# Patient Record
Sex: Female | Born: 1955 | Race: White | Hispanic: No | Marital: Married | Smoking: Former smoker
Health system: Southern US, Community
[De-identification: ages and names within clinical notes are randomized; demographics above are authoritative.]

## PROBLEM LIST (undated history)

## (undated) DIAGNOSIS — K219 Gastro-esophageal reflux disease without esophagitis: Secondary | ICD-10-CM

## (undated) DIAGNOSIS — F32A Depression, unspecified: Secondary | ICD-10-CM

## (undated) DIAGNOSIS — J449 Chronic obstructive pulmonary disease, unspecified: Secondary | ICD-10-CM

## (undated) DIAGNOSIS — M1990 Unspecified osteoarthritis, unspecified site: Secondary | ICD-10-CM

## (undated) DIAGNOSIS — I10 Essential (primary) hypertension: Secondary | ICD-10-CM

## (undated) DIAGNOSIS — J189 Pneumonia, unspecified organism: Secondary | ICD-10-CM

## (undated) DIAGNOSIS — Z8489 Family history of other specified conditions: Secondary | ICD-10-CM

## (undated) DIAGNOSIS — F1020 Alcohol dependence, uncomplicated: Secondary | ICD-10-CM

## (undated) DIAGNOSIS — M7660 Achilles tendinitis, unspecified leg: Secondary | ICD-10-CM

## (undated) DIAGNOSIS — E785 Hyperlipidemia, unspecified: Secondary | ICD-10-CM

## (undated) DIAGNOSIS — G459 Transient cerebral ischemic attack, unspecified: Secondary | ICD-10-CM

## (undated) DIAGNOSIS — I493 Ventricular premature depolarization: Secondary | ICD-10-CM

## (undated) DIAGNOSIS — F419 Anxiety disorder, unspecified: Secondary | ICD-10-CM

## (undated) DIAGNOSIS — G4733 Obstructive sleep apnea (adult) (pediatric): Secondary | ICD-10-CM

## (undated) DIAGNOSIS — F329 Major depressive disorder, single episode, unspecified: Secondary | ICD-10-CM

## (undated) DIAGNOSIS — Z9889 Other specified postprocedural states: Secondary | ICD-10-CM

## (undated) DIAGNOSIS — R0600 Dyspnea, unspecified: Secondary | ICD-10-CM

## (undated) HISTORY — DX: Achilles tendinitis, unspecified leg: M76.60

## (undated) HISTORY — DX: Chronic obstructive pulmonary disease, unspecified: J44.9

## (undated) HISTORY — DX: Essential (primary) hypertension: I10

## (undated) HISTORY — PX: TONSILLECTOMY: SUR1361

## (undated) HISTORY — DX: Transient cerebral ischemic attack, unspecified: G45.9

## (undated) HISTORY — DX: Obstructive sleep apnea (adult) (pediatric): G47.33

## (undated) HISTORY — DX: Hyperlipidemia, unspecified: E78.5

## (undated) HISTORY — PX: BREAST BIOPSY: SHX20

## (undated) HISTORY — DX: Major depressive disorder, single episode, unspecified: F32.9

## (undated) HISTORY — PX: CERVICAL SPINE SURGERY: SHX589

## (undated) HISTORY — DX: Depression, unspecified: F32.A

## (undated) HISTORY — DX: Ventricular premature depolarization: I49.3

## (undated) HISTORY — DX: Alcohol dependence, uncomplicated: F10.20

---

## 1996-12-13 HISTORY — PX: BREAST EXCISIONAL BIOPSY: SUR124

## 1998-07-05 ENCOUNTER — Inpatient Hospital Stay (HOSPITAL_COMMUNITY): Admission: EM | Admit: 1998-07-05 | Discharge: 1998-07-08 | Payer: Self-pay | Admitting: Emergency Medicine

## 1998-07-13 ENCOUNTER — Encounter (HOSPITAL_COMMUNITY): Admission: RE | Admit: 1998-07-13 | Discharge: 1998-10-11 | Payer: Self-pay | Admitting: Psychiatry

## 2000-01-07 ENCOUNTER — Encounter: Admission: RE | Admit: 2000-01-07 | Discharge: 2000-01-07 | Payer: Self-pay | Admitting: Internal Medicine

## 2000-01-10 ENCOUNTER — Encounter: Payer: Self-pay | Admitting: Internal Medicine

## 2000-03-21 ENCOUNTER — Other Ambulatory Visit: Admission: RE | Admit: 2000-03-21 | Discharge: 2000-03-21 | Payer: Self-pay | Admitting: Obstetrics and Gynecology

## 2000-05-22 ENCOUNTER — Encounter: Admission: RE | Admit: 2000-05-22 | Discharge: 2000-05-22 | Payer: Self-pay | Admitting: Family Medicine

## 2000-05-22 ENCOUNTER — Encounter: Payer: Self-pay | Admitting: Family Medicine

## 2000-05-30 ENCOUNTER — Encounter: Admission: RE | Admit: 2000-05-30 | Discharge: 2000-06-27 | Payer: Self-pay | Admitting: Family Medicine

## 2000-08-28 ENCOUNTER — Encounter: Payer: Self-pay | Admitting: Internal Medicine

## 2000-08-28 ENCOUNTER — Encounter: Admission: RE | Admit: 2000-08-28 | Discharge: 2000-08-28 | Payer: Self-pay | Admitting: Internal Medicine

## 2001-08-28 ENCOUNTER — Encounter: Admission: RE | Admit: 2001-08-28 | Discharge: 2001-08-28 | Payer: Self-pay | Admitting: Internal Medicine

## 2001-08-28 ENCOUNTER — Encounter: Payer: Self-pay | Admitting: Internal Medicine

## 2001-09-24 ENCOUNTER — Other Ambulatory Visit: Admission: RE | Admit: 2001-09-24 | Discharge: 2001-09-24 | Payer: Self-pay | Admitting: Internal Medicine

## 2002-12-02 ENCOUNTER — Encounter: Admission: RE | Admit: 2002-12-02 | Discharge: 2002-12-02 | Payer: Self-pay | Admitting: Internal Medicine

## 2002-12-02 ENCOUNTER — Encounter: Payer: Self-pay | Admitting: Internal Medicine

## 2004-04-27 ENCOUNTER — Encounter: Admission: RE | Admit: 2004-04-27 | Discharge: 2004-04-27 | Payer: Self-pay | Admitting: Internal Medicine

## 2005-06-13 ENCOUNTER — Encounter: Admission: RE | Admit: 2005-06-13 | Discharge: 2005-06-13 | Payer: Self-pay | Admitting: Internal Medicine

## 2005-10-19 ENCOUNTER — Ambulatory Visit: Payer: Self-pay | Admitting: Internal Medicine

## 2005-10-22 ENCOUNTER — Emergency Department: Payer: Self-pay | Admitting: Emergency Medicine

## 2005-12-01 ENCOUNTER — Other Ambulatory Visit: Payer: Self-pay

## 2005-12-01 ENCOUNTER — Inpatient Hospital Stay: Payer: Self-pay | Admitting: Internal Medicine

## 2005-12-09 ENCOUNTER — Ambulatory Visit: Payer: Self-pay | Admitting: Psychiatry

## 2006-08-01 ENCOUNTER — Encounter: Admission: RE | Admit: 2006-08-01 | Discharge: 2006-08-01 | Payer: Self-pay | Admitting: Internal Medicine

## 2006-09-19 ENCOUNTER — Ambulatory Visit (HOSPITAL_COMMUNITY): Admission: RE | Admit: 2006-09-19 | Discharge: 2006-09-19 | Payer: Self-pay | Admitting: Gastroenterology

## 2007-01-01 ENCOUNTER — Emergency Department (HOSPITAL_COMMUNITY): Admission: EM | Admit: 2007-01-01 | Discharge: 2007-01-02 | Payer: Self-pay | Admitting: Emergency Medicine

## 2007-08-03 ENCOUNTER — Other Ambulatory Visit: Admission: RE | Admit: 2007-08-03 | Discharge: 2007-08-03 | Payer: Self-pay | Admitting: Internal Medicine

## 2007-08-03 ENCOUNTER — Encounter: Admission: RE | Admit: 2007-08-03 | Discharge: 2007-08-03 | Payer: Self-pay | Admitting: Internal Medicine

## 2007-10-05 ENCOUNTER — Ambulatory Visit: Payer: Self-pay | Admitting: Internal Medicine

## 2007-10-05 ENCOUNTER — Encounter: Payer: Self-pay | Admitting: Family Medicine

## 2007-10-05 DIAGNOSIS — R079 Chest pain, unspecified: Secondary | ICD-10-CM | POA: Insufficient documentation

## 2007-11-19 ENCOUNTER — Telehealth: Payer: Self-pay | Admitting: Family Medicine

## 2008-05-01 ENCOUNTER — Emergency Department (HOSPITAL_COMMUNITY): Admission: EM | Admit: 2008-05-01 | Discharge: 2008-05-01 | Payer: Self-pay | Admitting: Emergency Medicine

## 2008-07-11 ENCOUNTER — Ambulatory Visit (HOSPITAL_COMMUNITY): Admission: RE | Admit: 2008-07-11 | Discharge: 2008-07-11 | Payer: Self-pay | Admitting: Internal Medicine

## 2008-08-28 ENCOUNTER — Ambulatory Visit (HOSPITAL_COMMUNITY): Admission: RE | Admit: 2008-08-28 | Discharge: 2008-08-28 | Payer: Self-pay | Admitting: Neurosurgery

## 2008-10-09 ENCOUNTER — Inpatient Hospital Stay (HOSPITAL_COMMUNITY): Admission: RE | Admit: 2008-10-09 | Discharge: 2008-10-10 | Payer: Self-pay | Admitting: Neurosurgery

## 2008-12-02 ENCOUNTER — Telehealth: Payer: Self-pay | Admitting: Family Medicine

## 2008-12-08 ENCOUNTER — Telehealth: Payer: Self-pay | Admitting: Family Medicine

## 2009-07-14 ENCOUNTER — Inpatient Hospital Stay (HOSPITAL_COMMUNITY): Admission: EM | Admit: 2009-07-14 | Discharge: 2009-07-15 | Payer: Self-pay | Admitting: Emergency Medicine

## 2010-09-07 ENCOUNTER — Encounter: Admission: RE | Admit: 2010-09-07 | Discharge: 2010-09-07 | Payer: Self-pay | Admitting: Internal Medicine

## 2010-11-14 ENCOUNTER — Encounter: Payer: Self-pay | Admitting: Internal Medicine

## 2010-11-15 ENCOUNTER — Encounter: Payer: Self-pay | Admitting: Internal Medicine

## 2011-01-28 LAB — DIFFERENTIAL
Basophils Absolute: 0.1 10*3/uL (ref 0.0–0.1)
Basophils Relative: 1 % (ref 0–1)
Eosinophils Relative: 2 % (ref 0–5)
Lymphocytes Relative: 9 % — ABNORMAL LOW (ref 12–46)
Lymphs Abs: 1 10*3/uL (ref 0.7–4.0)
Monocytes Absolute: 0.6 10*3/uL (ref 0.1–1.0)
Monocytes Relative: 6 % (ref 3–12)
Neutrophils Relative %: 82 % — ABNORMAL HIGH (ref 43–77)

## 2011-01-28 LAB — CBC
HCT: 40 % (ref 36.0–46.0)
HCT: 43 % (ref 36.0–46.0)
Hemoglobin: 13.8 g/dL (ref 12.0–15.0)
Hemoglobin: 14.6 g/dL (ref 12.0–15.0)
MCHC: 33.9 g/dL (ref 30.0–36.0)
MCV: 90.5 fL (ref 78.0–100.0)
Platelets: 216 10*3/uL (ref 150–400)
RBC: 4.43 MIL/uL (ref 3.87–5.11)
RBC: 4.75 MIL/uL (ref 3.87–5.11)
RDW: 13.5 % (ref 11.5–15.5)

## 2011-01-28 LAB — COMPREHENSIVE METABOLIC PANEL
BUN: 13 mg/dL (ref 6–23)
Calcium: 9.7 mg/dL (ref 8.4–10.5)
GFR calc non Af Amer: >60 mL/min (ref >60)
Sodium: 131 mEq/L — ABNORMAL LOW (ref 135–145)

## 2011-01-28 LAB — BASIC METABOLIC PANEL
BUN: 10 mg/dL (ref 6–23)
CO2: 25 mEq/L (ref 19–32)
Calcium: 8.9 mg/dL (ref 8.4–10.5)
Chloride: 94 mEq/L — ABNORMAL LOW (ref 96–112)
Creatinine, Ser: 0.7 mg/dL (ref 0.4–1.2)
GFR calc non Af Amer: >60 mL/min (ref >60)
Glucose, Bld: 117 mg/dL — ABNORMAL HIGH (ref 70–99)
Potassium: 3.4 mEq/L — ABNORMAL LOW (ref 3.5–5.1)

## 2011-01-28 LAB — PROTIME-INR: Prothrombin Time: 11.2 seconds — ABNORMAL LOW (ref 11.6–15.2)

## 2011-01-28 LAB — APTT: aPTT: 24 seconds (ref 24–37)

## 2011-03-08 NOTE — Op Note (Signed)
NAMEMACKENSEY, BOLTE NO.:  [id number]   MEDICAL RECORD NO.:  [id number]          PATIENT TYPE:  INP   LOCATION:  3028                         FACILITY:  [facility]   PHYSICIAN:  Cristi Loron, M.D.DATE OF BIRTH:  September 29, 1956   DATE OF PROCEDURE:  10/09/2008  DATE OF DISCHARGE:                               OPERATIVE REPORT   BRIEF HISTORY:  The patient is a 55 year old white female who has  suffered from neck and arm pain and a cervical myelopathy.  She failed  medical management worked up with a cervical MRI, which demonstrated  that the patient had spondylolisthesis at C3-C4 with stenosis and  spondylosis at C3-C4, C4-C5, C5-C6.  I discussed the various treatment  option with the patient including surgery.  She has weighed the risks,  benefits, and alternatives of surgery, and decided to proceed with C3-  C4, C4-C5, C5-C6 anterior cervical diskectomy fusion and plating.   PREOPERATIVE DIAGNOSES:  C3-C4, C4-C5, C5-C6 disk degeneration,  spondylosis, stenosis, spondylolisthesis, cervical  myelopathy/radiculopathy, cervicalgia.   POSTOPERATIVE DIAGNOSES:  C3-C4, C4-C5, C5-C6 disk degeneration,  spondylosis, stenosis, spondylolisthesis, cervical  myelopathy/radiculopathy, cervicalgia.   PROCEDURE:  C3-C4, C4-C5, C5-C6 extensive anterior cervical  diskectomy/decompression; C3-C4, C4-C5, C5-C6 anterior interbody  arthrodesis with local morselized autograft bone and Actifuse bone graft  extender; insertion of interbody prosthesis at C3-C4, C4-C5, C5-C6  (Novel PEEK interbody prosthesis); anterior cervical plate at L2-G4 with  Medtronic Atlantis translational plate.   SURGEON:  Cristi Loron, MD   ASSISTANT:  Clydene Fake, MD   ANESTHESIA:  General endotracheal.   ESTIMATED BLOOD LOSS:  150 mL.   SPECIMENS:  None.   DRAINS:  None.   COMPLICATIONS:  None.   DESCRIPTION OF PROCEDURE:  The patient was brought to the operating room  by anesthesia  team.  General endotracheal anesthesia was induced.  The  patient remained in the supine position.  A roll was placed under  shoulders and placed her neck in slight extension.  Her anterior  cervical region was then prepared with Betadine scrub with Betadine  solution.  Sterile drapes were applied.  I then injected the area to be  incised with Marcaine with epinephrine solution.  I used a scalpel to  make a transverse incision in the patient's left anterior neck.  I used  the Metzenbaum scissors to divide the platysma muscle and then to  dissect medial to sternocleidomastoid muscle, jugular vein, and carotid  artery.  I carefully dissected down towards the anterior cervical spine  identifying the esophagus and retracting medially and then used Kitner  swab to clear soft tissue from the anterior cervical spine exposing the  intervertebral disk spaces.  We then inserted a bent spinal needle into  the upper exposed intervertebral disk space.  We obtained intraoperative  radiograph to confirm our location.   We then used electrocautery to detach the medial border of the longus  colli muscle bilaterally from C3-C4, C4-C5, and C5-C6 interspaces.  We  then inserted the Caspar self-retaining retractor underneath the longus  colli muscle bilaterally to provide exposure.  We began  the  decompression at C3-C4.  We incised the C3-C4 intervertebral disk with a  #15-blade scalpel and performed a partial intervertebral diskectomy with  a pituitary forceps and the Carlens curettes.  We then inserted the  distraction screws at C3-C4 and distracted interspace.  We then used a  high-speed drill to decorticate the vertebral endplates at C3-C4, drill  away the remainder of C3-C4 intervertebral disk to drill away some  posterior spondylosis, and then to thin out the posterior longitudinal  ligament.  We then incised the ligament with arachnoid knife and then  removed it with a Kerrison punch undercutting the  vertebral endplates at  C3-C4 decompressing the thecal sac.  We then performed a foraminotomy  about the bilateral C4 nerve root completing the decompression at this  level.   We then repeated this procedure in an analogous fashion at C4-C5, C5-C6  decompressing the C4-C5 and C5-C6 thecal sac and bilateral C5-C6 nerve  roots.  This completed the decompression.   We now turned our attention to the arthrodesis.  We used the trial  spacers and determined to use a 5-mm medium PEEK interbody prosthesis at  each level.  We prefilled this prosthesis with a combination of local  autograft bone.  We obtained the decompression as well as Actifuse bone  graft extender.  We then inserted the prosthesis into the distracted  interspaces at C3-C4, C4-C5, and C5-C6.  We then removed distraction  screws.  There was a good snug fit of the prosthesis at each level.   We now turned our attention to anterior spinal instrumentation.  We used  a high-speed drill to remove some ventral spondylosis from the vertebral  endplates at C3-C4, C4-C5, and C5-C6, so that the plate will lay down  flat.  We selected the appropriate-length Atlantis translational  anterior cervical plate and laid it along the anterior aspect of the  vertebral bodies from C3-C6.  We then drilled two 11-mm holes at C3-C4,  C5-C6 and then secured the plate to their vertebral bodies by placing  two 14-mm screws at C3-C4, C5-C6.  We obtained intraoperative  radiograph, which demonstrated good position of instrumentation with  limited visualization of the lower plate screws because of the patient's  shoulders, but it looked good in vivo.  We, therefore, secured the  screws and plate by locking the cams.  This completed the  instrumentation.   We then obtained hemostasis using bipolar electrocautery.  We irrigated  the wound out with bacitracin solution.  We then removed the retractor.  We then inspected the esophagus for any damages, none  apparent.  We then  reapproximated the patient's platysma muscle with interrupted 3-0 Vicryl  suture, subcutaneous tissue with interrupted 3-0 Vicryl suture, and the  skin with Steri-Strips and benzoin.  The wound was then coated with  bacitracin ointment.  A sterile dressing was applied.  The drapes were  removed.  The patient was subsequently extubated by the anesthesia team  and transported to postanesthesia care unit in stable condition.  All  sponge, instrument, and needle counts were correct at the end of this  case.      Cristi Loron, M.D.  Electronically Signed     JDJ/MEDQ  D:  10/09/2008  T:  10/10/2008  Job:  [id number]

## 2011-07-26 LAB — BASIC METABOLIC PANEL
BUN: 6
CO2: 28
GFR calc non Af Amer: >60
Glucose, Bld: 88
Potassium: 4.5

## 2011-07-26 LAB — CBC
HCT: 46.6 — ABNORMAL HIGH
MCHC: 34.2
MCV: 93.9
Platelets: 281
RDW: 13.2

## 2011-07-29 LAB — BASIC METABOLIC PANEL
BUN: 9 mg/dL (ref 6–23)
Calcium: 9.5 mg/dL (ref 8.4–10.5)
Creatinine, Ser: 0.61 mg/dL (ref 0.4–1.2)
GFR calc non Af Amer: >60 mL/min (ref >60)
Glucose, Bld: 100 mg/dL — ABNORMAL HIGH (ref 70–99)

## 2011-07-29 LAB — CBC
Platelets: 312 10*3/uL (ref 150–400)
RDW: 13 % (ref 11.5–15.5)
WBC: 6.1 10*3/uL (ref 4.0–10.5)

## 2012-12-18 ENCOUNTER — Other Ambulatory Visit: Payer: Self-pay | Admitting: Internal Medicine

## 2012-12-24 ENCOUNTER — Ambulatory Visit
Admission: RE | Admit: 2012-12-24 | Discharge: 2012-12-24 | Disposition: A | Discharge status: Home / Self Care | Payer: Self-pay | Source: Ambulatory Visit | Attending: Internal Medicine | Admitting: Internal Medicine

## 2012-12-24 DIAGNOSIS — R29898 Other symptoms and signs involving the musculoskeletal system: Secondary | ICD-10-CM

## 2012-12-24 MED ORDER — GADOBENATE DIMEGLUMINE 529 MG/ML IV SOLN: 17.0000 mL | Freq: Once | INTRAVENOUS | Status: AC | PRN

## 2013-04-19 DIAGNOSIS — R1032 Left lower quadrant pain: Secondary | ICD-10-CM | POA: Diagnosis not present

## 2013-04-22 DIAGNOSIS — R21 Rash and other nonspecific skin eruption: Secondary | ICD-10-CM | POA: Diagnosis not present

## 2013-07-11 DIAGNOSIS — G4733 Obstructive sleep apnea (adult) (pediatric): Secondary | ICD-10-CM | POA: Diagnosis not present

## 2013-07-11 DIAGNOSIS — F17200 Nicotine dependence, unspecified, uncomplicated: Secondary | ICD-10-CM | POA: Diagnosis not present

## 2013-07-11 DIAGNOSIS — I10 Essential (primary) hypertension: Secondary | ICD-10-CM | POA: Diagnosis not present

## 2013-07-11 DIAGNOSIS — F325 Major depressive disorder, single episode, in full remission: Secondary | ICD-10-CM | POA: Diagnosis not present

## 2013-07-11 DIAGNOSIS — J449 Chronic obstructive pulmonary disease, unspecified: Secondary | ICD-10-CM | POA: Diagnosis not present

## 2013-09-27 DIAGNOSIS — K5900 Constipation, unspecified: Secondary | ICD-10-CM | POA: Diagnosis not present

## 2013-09-27 DIAGNOSIS — R109 Unspecified abdominal pain: Secondary | ICD-10-CM | POA: Diagnosis not present

## 2013-09-27 DIAGNOSIS — R5381 Other malaise: Secondary | ICD-10-CM | POA: Diagnosis not present

## 2013-10-03 DIAGNOSIS — R1012 Left upper quadrant pain: Secondary | ICD-10-CM | POA: Diagnosis not present

## 2013-10-03 DIAGNOSIS — F17200 Nicotine dependence, unspecified, uncomplicated: Secondary | ICD-10-CM | POA: Diagnosis not present

## 2014-01-08 ENCOUNTER — Other Ambulatory Visit: Payer: Self-pay

## 2014-01-08 ENCOUNTER — Other Ambulatory Visit: Payer: Self-pay | Admitting: Internal Medicine

## 2014-01-08 ENCOUNTER — Other Ambulatory Visit (HOSPITAL_COMMUNITY)
Admission: RE | Admit: 2014-01-08 | Discharge: 2014-01-08 | Disposition: A | Discharge status: Home / Self Care | Payer: Medicare Other | Source: Ambulatory Visit | Attending: Internal Medicine | Admitting: Internal Medicine

## 2014-01-08 DIAGNOSIS — Z124 Encounter for screening for malignant neoplasm of cervix: Secondary | ICD-10-CM | POA: Diagnosis not present

## 2014-01-08 DIAGNOSIS — Z1331 Encounter for screening for depression: Secondary | ICD-10-CM | POA: Diagnosis not present

## 2014-01-08 DIAGNOSIS — Z1231 Encounter for screening mammogram for malignant neoplasm of breast: Secondary | ICD-10-CM

## 2014-01-08 DIAGNOSIS — Z0000 Encounter for general adult medical examination without abnormal findings: Secondary | ICD-10-CM | POA: Diagnosis not present

## 2014-01-08 DIAGNOSIS — J449 Chronic obstructive pulmonary disease, unspecified: Secondary | ICD-10-CM | POA: Diagnosis not present

## 2014-01-08 DIAGNOSIS — Z1151 Encounter for screening for human papillomavirus (HPV): Secondary | ICD-10-CM | POA: Diagnosis not present

## 2014-01-08 DIAGNOSIS — I10 Essential (primary) hypertension: Secondary | ICD-10-CM | POA: Diagnosis not present

## 2014-01-23 ENCOUNTER — Ambulatory Visit
Admission: RE | Admit: 2014-01-23 | Discharge: 2014-01-23 | Disposition: A | Discharge status: Home / Self Care | Payer: Medicare Other | Source: Ambulatory Visit

## 2014-01-23 DIAGNOSIS — Z1231 Encounter for screening mammogram for malignant neoplasm of breast: Secondary | ICD-10-CM | POA: Diagnosis not present

## 2014-03-20 DIAGNOSIS — H02839 Dermatochalasis of unspecified eye, unspecified eyelid: Secondary | ICD-10-CM | POA: Diagnosis not present

## 2014-03-20 DIAGNOSIS — H2510 Age-related nuclear cataract, unspecified eye: Secondary | ICD-10-CM | POA: Diagnosis not present

## 2014-05-12 DIAGNOSIS — H02839 Dermatochalasis of unspecified eye, unspecified eyelid: Secondary | ICD-10-CM | POA: Diagnosis not present

## 2014-05-12 DIAGNOSIS — H02419 Mechanical ptosis of unspecified eyelid: Secondary | ICD-10-CM | POA: Diagnosis not present

## 2014-05-12 DIAGNOSIS — H01029 Squamous blepharitis unspecified eye, unspecified eyelid: Secondary | ICD-10-CM | POA: Diagnosis not present

## 2014-05-12 DIAGNOSIS — H11439 Conjunctival hyperemia, unspecified eye: Secondary | ICD-10-CM | POA: Diagnosis not present

## 2014-05-16 DIAGNOSIS — H5340 Unspecified visual field defects: Secondary | ICD-10-CM | POA: Diagnosis not present

## 2014-05-16 DIAGNOSIS — H02839 Dermatochalasis of unspecified eye, unspecified eyelid: Secondary | ICD-10-CM | POA: Diagnosis not present

## 2014-05-29 DIAGNOSIS — F17200 Nicotine dependence, unspecified, uncomplicated: Secondary | ICD-10-CM | POA: Diagnosis not present

## 2014-05-29 DIAGNOSIS — I10 Essential (primary) hypertension: Secondary | ICD-10-CM | POA: Diagnosis not present

## 2014-07-10 DIAGNOSIS — F325 Major depressive disorder, single episode, in full remission: Secondary | ICD-10-CM | POA: Diagnosis not present

## 2014-07-10 DIAGNOSIS — J449 Chronic obstructive pulmonary disease, unspecified: Secondary | ICD-10-CM | POA: Diagnosis not present

## 2014-07-10 DIAGNOSIS — I10 Essential (primary) hypertension: Secondary | ICD-10-CM | POA: Diagnosis not present

## 2014-07-10 DIAGNOSIS — E785 Hyperlipidemia, unspecified: Secondary | ICD-10-CM | POA: Diagnosis not present

## 2014-07-10 DIAGNOSIS — F17200 Nicotine dependence, unspecified, uncomplicated: Secondary | ICD-10-CM | POA: Diagnosis not present

## 2014-07-10 DIAGNOSIS — Z23 Encounter for immunization: Secondary | ICD-10-CM | POA: Diagnosis not present

## 2014-07-10 DIAGNOSIS — M549 Dorsalgia, unspecified: Secondary | ICD-10-CM | POA: Diagnosis not present

## 2015-01-08 DIAGNOSIS — J449 Chronic obstructive pulmonary disease, unspecified: Secondary | ICD-10-CM | POA: Diagnosis not present

## 2015-01-08 DIAGNOSIS — I10 Essential (primary) hypertension: Secondary | ICD-10-CM | POA: Diagnosis not present

## 2015-01-08 DIAGNOSIS — F325 Major depressive disorder, single episode, in full remission: Secondary | ICD-10-CM | POA: Diagnosis not present

## 2015-01-08 DIAGNOSIS — Z1389 Encounter for screening for other disorder: Secondary | ICD-10-CM | POA: Diagnosis not present

## 2015-01-08 DIAGNOSIS — F17210 Nicotine dependence, cigarettes, uncomplicated: Secondary | ICD-10-CM | POA: Diagnosis not present

## 2015-02-02 ENCOUNTER — Other Ambulatory Visit: Payer: Self-pay

## 2015-02-02 DIAGNOSIS — Z1231 Encounter for screening mammogram for malignant neoplasm of breast: Secondary | ICD-10-CM

## 2015-02-04 DIAGNOSIS — R590 Localized enlarged lymph nodes: Secondary | ICD-10-CM | POA: Diagnosis not present

## 2015-03-11 ENCOUNTER — Ambulatory Visit
Admission: RE | Admit: 2015-03-11 | Discharge: 2015-03-11 | Disposition: A | Discharge status: Home / Self Care | Payer: Medicare Other | Source: Ambulatory Visit

## 2015-03-11 DIAGNOSIS — Z1231 Encounter for screening mammogram for malignant neoplasm of breast: Secondary | ICD-10-CM

## 2015-06-25 DIAGNOSIS — R21 Rash and other nonspecific skin eruption: Secondary | ICD-10-CM | POA: Diagnosis not present

## 2015-07-17 DIAGNOSIS — L821 Other seborrheic keratosis: Secondary | ICD-10-CM | POA: Diagnosis not present

## 2015-07-17 DIAGNOSIS — L309 Dermatitis, unspecified: Secondary | ICD-10-CM | POA: Diagnosis not present

## 2015-07-17 DIAGNOSIS — D2371 Other benign neoplasm of skin of right lower limb, including hip: Secondary | ICD-10-CM | POA: Diagnosis not present

## 2015-07-20 DIAGNOSIS — J449 Chronic obstructive pulmonary disease, unspecified: Secondary | ICD-10-CM | POA: Diagnosis not present

## 2015-07-20 DIAGNOSIS — F1020 Alcohol dependence, uncomplicated: Secondary | ICD-10-CM | POA: Diagnosis not present

## 2015-07-20 DIAGNOSIS — E669 Obesity, unspecified: Secondary | ICD-10-CM | POA: Diagnosis not present

## 2015-07-20 DIAGNOSIS — F329 Major depressive disorder, single episode, unspecified: Secondary | ICD-10-CM | POA: Diagnosis not present

## 2015-07-20 DIAGNOSIS — I10 Essential (primary) hypertension: Secondary | ICD-10-CM | POA: Diagnosis not present

## 2015-07-20 DIAGNOSIS — Z720 Tobacco use: Secondary | ICD-10-CM | POA: Diagnosis not present

## 2015-07-20 DIAGNOSIS — M25552 Pain in left hip: Secondary | ICD-10-CM | POA: Diagnosis not present

## 2015-07-20 DIAGNOSIS — Z6838 Body mass index (BMI) 38.0-38.9, adult: Secondary | ICD-10-CM | POA: Diagnosis not present

## 2015-07-28 DIAGNOSIS — G4733 Obstructive sleep apnea (adult) (pediatric): Secondary | ICD-10-CM | POA: Diagnosis not present

## 2015-07-28 DIAGNOSIS — J449 Chronic obstructive pulmonary disease, unspecified: Secondary | ICD-10-CM | POA: Diagnosis not present

## 2015-08-28 DIAGNOSIS — J449 Chronic obstructive pulmonary disease, unspecified: Secondary | ICD-10-CM | POA: Diagnosis not present

## 2015-08-28 DIAGNOSIS — G4733 Obstructive sleep apnea (adult) (pediatric): Secondary | ICD-10-CM | POA: Diagnosis not present

## 2015-09-27 DIAGNOSIS — J449 Chronic obstructive pulmonary disease, unspecified: Secondary | ICD-10-CM | POA: Diagnosis not present

## 2015-09-27 DIAGNOSIS — G4733 Obstructive sleep apnea (adult) (pediatric): Secondary | ICD-10-CM | POA: Diagnosis not present

## 2015-10-06 DIAGNOSIS — J069 Acute upper respiratory infection, unspecified: Secondary | ICD-10-CM | POA: Diagnosis not present

## 2015-10-28 DIAGNOSIS — G4733 Obstructive sleep apnea (adult) (pediatric): Secondary | ICD-10-CM | POA: Diagnosis not present

## 2015-10-28 DIAGNOSIS — J449 Chronic obstructive pulmonary disease, unspecified: Secondary | ICD-10-CM | POA: Diagnosis not present

## 2015-11-18 ENCOUNTER — Other Ambulatory Visit: Payer: Self-pay | Admitting: Internal Medicine

## 2015-11-18 DIAGNOSIS — R1011 Right upper quadrant pain: Secondary | ICD-10-CM

## 2015-11-18 DIAGNOSIS — R112 Nausea with vomiting, unspecified: Secondary | ICD-10-CM | POA: Diagnosis not present

## 2015-11-18 DIAGNOSIS — R109 Unspecified abdominal pain: Secondary | ICD-10-CM | POA: Diagnosis not present

## 2015-11-25 ENCOUNTER — Ambulatory Visit
Admission: RE | Admit: 2015-11-25 | Discharge: 2015-11-25 | Disposition: A | Discharge status: Home / Self Care | Payer: Commercial Managed Care - HMO | Source: Ambulatory Visit | Attending: Internal Medicine | Admitting: Internal Medicine

## 2015-11-25 DIAGNOSIS — R112 Nausea with vomiting, unspecified: Secondary | ICD-10-CM

## 2015-11-25 DIAGNOSIS — R1011 Right upper quadrant pain: Secondary | ICD-10-CM

## 2015-11-25 DIAGNOSIS — R109 Unspecified abdominal pain: Secondary | ICD-10-CM

## 2015-11-25 DIAGNOSIS — K8020 Calculus of gallbladder without cholecystitis without obstruction: Secondary | ICD-10-CM | POA: Diagnosis not present

## 2015-11-27 DIAGNOSIS — K8020 Calculus of gallbladder without cholecystitis without obstruction: Secondary | ICD-10-CM | POA: Diagnosis not present

## 2015-11-27 DIAGNOSIS — M546 Pain in thoracic spine: Secondary | ICD-10-CM | POA: Diagnosis not present

## 2015-11-28 DIAGNOSIS — G4733 Obstructive sleep apnea (adult) (pediatric): Secondary | ICD-10-CM | POA: Diagnosis not present

## 2015-11-28 DIAGNOSIS — J449 Chronic obstructive pulmonary disease, unspecified: Secondary | ICD-10-CM | POA: Diagnosis not present

## 2015-12-26 DIAGNOSIS — G4733 Obstructive sleep apnea (adult) (pediatric): Secondary | ICD-10-CM | POA: Diagnosis not present

## 2015-12-26 DIAGNOSIS — J449 Chronic obstructive pulmonary disease, unspecified: Secondary | ICD-10-CM | POA: Diagnosis not present

## 2016-01-15 DIAGNOSIS — G4733 Obstructive sleep apnea (adult) (pediatric): Secondary | ICD-10-CM | POA: Diagnosis not present

## 2016-01-15 DIAGNOSIS — Z0000 Encounter for general adult medical examination without abnormal findings: Secondary | ICD-10-CM | POA: Diagnosis not present

## 2016-01-15 DIAGNOSIS — E669 Obesity, unspecified: Secondary | ICD-10-CM | POA: Diagnosis not present

## 2016-01-15 DIAGNOSIS — E7800 Pure hypercholesterolemia, unspecified: Secondary | ICD-10-CM | POA: Diagnosis not present

## 2016-01-15 DIAGNOSIS — F325 Major depressive disorder, single episode, in full remission: Secondary | ICD-10-CM | POA: Diagnosis not present

## 2016-01-15 DIAGNOSIS — J449 Chronic obstructive pulmonary disease, unspecified: Secondary | ICD-10-CM | POA: Diagnosis not present

## 2016-01-15 DIAGNOSIS — Z1389 Encounter for screening for other disorder: Secondary | ICD-10-CM | POA: Diagnosis not present

## 2016-01-15 DIAGNOSIS — I10 Essential (primary) hypertension: Secondary | ICD-10-CM | POA: Diagnosis not present

## 2016-01-15 DIAGNOSIS — F17200 Nicotine dependence, unspecified, uncomplicated: Secondary | ICD-10-CM | POA: Diagnosis not present

## 2016-01-26 DIAGNOSIS — G4733 Obstructive sleep apnea (adult) (pediatric): Secondary | ICD-10-CM | POA: Diagnosis not present

## 2016-01-26 DIAGNOSIS — J449 Chronic obstructive pulmonary disease, unspecified: Secondary | ICD-10-CM | POA: Diagnosis not present

## 2016-02-08 DIAGNOSIS — R509 Fever, unspecified: Secondary | ICD-10-CM | POA: Diagnosis not present

## 2016-02-08 DIAGNOSIS — J069 Acute upper respiratory infection, unspecified: Secondary | ICD-10-CM | POA: Diagnosis not present

## 2016-02-08 DIAGNOSIS — R05 Cough: Secondary | ICD-10-CM | POA: Diagnosis not present

## 2016-02-25 DIAGNOSIS — J449 Chronic obstructive pulmonary disease, unspecified: Secondary | ICD-10-CM | POA: Diagnosis not present

## 2016-02-25 DIAGNOSIS — G4733 Obstructive sleep apnea (adult) (pediatric): Secondary | ICD-10-CM | POA: Diagnosis not present

## 2016-03-10 DIAGNOSIS — J209 Acute bronchitis, unspecified: Secondary | ICD-10-CM | POA: Diagnosis not present

## 2016-03-27 DIAGNOSIS — G4733 Obstructive sleep apnea (adult) (pediatric): Secondary | ICD-10-CM | POA: Diagnosis not present

## 2016-03-27 DIAGNOSIS — J449 Chronic obstructive pulmonary disease, unspecified: Secondary | ICD-10-CM | POA: Diagnosis not present

## 2016-04-14 ENCOUNTER — Other Ambulatory Visit: Payer: Self-pay | Admitting: Gastroenterology

## 2016-04-14 DIAGNOSIS — K621 Rectal polyp: Secondary | ICD-10-CM | POA: Diagnosis not present

## 2016-04-14 DIAGNOSIS — Z1211 Encounter for screening for malignant neoplasm of colon: Secondary | ICD-10-CM | POA: Diagnosis not present

## 2016-04-26 DIAGNOSIS — G4733 Obstructive sleep apnea (adult) (pediatric): Secondary | ICD-10-CM | POA: Diagnosis not present

## 2016-04-26 DIAGNOSIS — J449 Chronic obstructive pulmonary disease, unspecified: Secondary | ICD-10-CM | POA: Diagnosis not present

## 2016-05-27 DIAGNOSIS — G4733 Obstructive sleep apnea (adult) (pediatric): Secondary | ICD-10-CM | POA: Diagnosis not present

## 2016-05-27 DIAGNOSIS — J449 Chronic obstructive pulmonary disease, unspecified: Secondary | ICD-10-CM | POA: Diagnosis not present

## 2016-06-27 DIAGNOSIS — J449 Chronic obstructive pulmonary disease, unspecified: Secondary | ICD-10-CM | POA: Diagnosis not present

## 2016-06-27 DIAGNOSIS — G4733 Obstructive sleep apnea (adult) (pediatric): Secondary | ICD-10-CM | POA: Diagnosis not present

## 2016-07-18 DIAGNOSIS — Z23 Encounter for immunization: Secondary | ICD-10-CM | POA: Diagnosis not present

## 2016-07-18 DIAGNOSIS — I10 Essential (primary) hypertension: Secondary | ICD-10-CM | POA: Diagnosis not present

## 2016-07-18 DIAGNOSIS — R200 Anesthesia of skin: Secondary | ICD-10-CM | POA: Diagnosis not present

## 2016-07-18 DIAGNOSIS — J449 Chronic obstructive pulmonary disease, unspecified: Secondary | ICD-10-CM | POA: Diagnosis not present

## 2016-09-28 ENCOUNTER — Encounter: Payer: Self-pay | Admitting: Neurology

## 2016-09-28 ENCOUNTER — Ambulatory Visit (INDEPENDENT_AMBULATORY_CARE_PROVIDER_SITE_OTHER): Payer: Commercial Managed Care - HMO | Admitting: Neurology

## 2016-09-28 VITALS — BP 130/88 | HR 74 | Ht 60.0 in | Wt 191.5 lb

## 2016-09-28 DIAGNOSIS — M4802 Spinal stenosis, cervical region: Secondary | ICD-10-CM | POA: Insufficient documentation

## 2016-09-28 DIAGNOSIS — G5603 Carpal tunnel syndrome, bilateral upper limbs: Secondary | ICD-10-CM | POA: Diagnosis not present

## 2016-09-28 DIAGNOSIS — F17200 Nicotine dependence, unspecified, uncomplicated: Secondary | ICD-10-CM

## 2016-09-28 NOTE — Progress Notes (Signed)
[facility] Clinic Note - Initial Visit   Date: 09/28/16  LILLER ASFOUR MRN: [id number] DOB: [birth year]   Dear Dr. Laurann Montana:  Thank you for your kind referral of Christina Walsh [facility] for consultation of bilateral hand paresthesias. Although her history is well known to you, please allow [name] to reiterate it for the purpose of our medical record. The patient was accompanied to the clinic by self.    History of Present Illness: Christina Walsh is a 60 y.o. left-handed Caucasian female with hypertension, COPD, tobacco use, depression, hyperlipidemia, multilevel compressive cervical myelopathy with cord compression at C3-4 and stenosis at C3-4, C4-5, and C5-6 (2009) s/p ACDF presenting for evaluation of bilateral hand paresthesias.    Starting in 2009, she starting have numbness/tingling over the left forearm and hand and thought she was having TIAs.  Eventually symptoms started involving her right hand.  MRI cervical spine showed severe multilevel degenerative changes with cord compression and edema at C3-4, she also had central stenosis at C4-5 and C5-6.  She had ACDF by Dr. Arnoldo Morale which alleviated her neck pain, but did not resolve her hand paresthesias. Her forearm paresthesias have remained constant and unchanged since her surgery.  However, in September 2017, she began noticing worsening right hand tingling and numbness, especially over the first three fingers which is new.  She is dropping objects. She enjoys sewing and often cannot feel the fabric in her fingers, so sometimes will have to detect sensation by applying the material against her face to determine the texture.  She also reports playing games online for about 4 hours nightly and uses the mouse with her right hand repetitively.     Out-side paper records, electronic medical record, and images have been reviewed where available and summarized as:  MRI cervical spine wo contrast 07/12/2008: 1.  Severe  multilevel mid and upper cervical degenerative disease, with cord compression and edema at C3-C4.  Because of the cord edema, Dr. Gerilyn Pilgrim telephoned the on-call physician for Dr. Laurann Montana, Dr. Maxwell Caul, at [phone] hours on date of interpretation, 07/12/2008. 2.  Severe multilevel facet arthrosis, age advanced.  Favor inflammatory arthritis such as pseudogout/CPPD. 3.  With associated subchondral inflammatory changes. 4.  Severe multifactorial central stenosis C3-C4 and C4-C5, with moderate C5-C6 central stenosis. 5.  Multilevel bilateral foraminal narrowing due to uncovertebral spurring and facet spurring.  MRI brain wwo contrast 12/24/2012: No acute intracranial abnormality.  No evidence of acute or chronic ischemia. Spinal stenosis at the C2 level in the cervical spine.   Past Medical History:  Diagnosis Date  . Achilles tendinitis   . Alcoholism ([place])   . COPD (chronic obstructive pulmonary disease) ([place])   . Depression   . Hyperlipidemia   . Hypertension   . OSA (obstructive sleep apnea)   . PVC (premature ventricular contraction)   . TIA (transient ischemic attack)     Past Surgical History:  Procedure Laterality Date  . BREAST BIOPSY    . CERVICAL SPINE SURGERY    . CESAREAN SECTION    . TONSILLECTOMY       Medications:  Outpatient Encounter Prescriptions as of 09/28/2016  Medication Sig Note  . aspirin EC 81 MG tablet Take 81 mg by mouth daily.   Marland Kitchen buPROPion (WELLBUTRIN XL) 150 MG 24 hr tablet  09/28/2016: Received from: External Pharmacy  . CARTIA XT 240 MG 24 hr capsule  09/28/2016: Received from: External Pharmacy  . cloNIDine (CATAPRES) 0.1 MG tablet  09/28/2016: Received from:  External Pharmacy  . diazepam (VALIUM) 5 MG tablet  09/28/2016: Received from: External Pharmacy  . diltiazem (DILACOR XR) 240 MG 24 hr capsule Take 240 mg by mouth daily.   . fluocinonide ointment (LIDEX) 0.05 %  09/28/2016: Received from: External Pharmacy  . HYDROcodone-acetaminophen (NORCO) 10-325  MG tablet  09/28/2016: Received from: External Pharmacy  . losartan-hydrochlorothiazide (HYZAAR) 100-12.5 MG tablet  09/28/2016: Received from: External Pharmacy  . PARoxetine (PAXIL) 40 MG tablet  09/28/2016: Received from: External Pharmacy   No facility-administered encounter medications on file as of 09/28/2016.      Allergies:  Allergies  Allergen Reactions  . Augmentin [Amoxicillin-Pot Clavulanate]   . Celexa [Citalopram Hydrobromide]   . Ciprocin-Fluocin-Procin [Fluocinolone]   . Codeine   . Coppertone Sport Spray Spf30 [Sunscreens]   . Cozaar [Losartan Potassium]   . Felodipine   . Norvasc [Amlodipine Besylate]   . Zoloft [Sertraline Hcl]     Family History: Family History  Problem Relation Age of Onset  . Alcohol abuse Mother   . Hypertension Father   . Heart attack Father   . Heart disease Father     Social History: Social History  Substance Use Topics  . Smoking status: Current Every Day Smoker  . Smokeless tobacco: Never Used  . Alcohol use No   Social History   Social History Narrative   Lives with husband in a one story home.  Has 2 grown children.  On disability.  Education: some college.     Review of Systems:  CONSTITUTIONAL: No fevers, chills, night sweats, or weight loss.   EYES: No visual changes or eye pain ENT: No hearing changes.  No history of nose bleeds.   RESPIRATORY: No cough, wheezing and shortness of breath.   CARDIOVASCULAR: Negative for chest pain, and palpitations.   GI: Negative for abdominal discomfort, blood in stools or black stools.  No recent change in bowel habits.   GU:  No history of incontinence.   MUSCLOSKELETAL: +history of joint pain or swelling.  No myalgias.   SKIN: Negative for lesions, rash, and itching.   HEMATOLOGY/ONCOLOGY: Negative for prolonged bleeding, bruising easily, and swollen nodes.  No history of cancer.   ENDOCRINE: Negative for cold or heat intolerance, polydipsia or goiter.   PSYCH:  +depression or  anxiety symptoms.   NEURO: As Above.   Vital Signs:  BP 130/88   Pulse 74   Ht 5' (1.524 m)   Wt 191 lb 8 oz (86.9 kg)   SpO2 96%   BMI 37.40 kg/m    General Medical Exam:   General:  Well appearing, comfortable.   Eyes/ENT: see cranial nerve examination.   Neck: No masses appreciated.  Full range of motion without tenderness.  No carotid bruits. Respiratory:  Clear to auscultation, good air entry bilaterally.   Cardiac:  Regular rate and rhythm, no murmur.   Extremities:  No deformities, edema, or skin discoloration.  Skin:  No rashes or lesions.  Neurological Exam: MENTAL STATUS including orientation to time, place, person, recent and remote memory, attention span and concentration, language, and fund of knowledge is normal.  Speech is not dysarthric.  CRANIAL NERVES: II:  No visual field defects.  Unremarkable fundi.   III-IV-VI: Pupils equal round and reactive to light.  Normal conjugate, extra-ocular eye movements in all directions of gaze.  No nystagmus.  No ptosis.   V:  Normal facial sensation.     VII:  Normal facial symmetry and movements.  VIII:  Normal hearing and vestibular function.   IX-X:  Normal palatal movement.   XI:  Normal shoulder shrug and head rotation.   XII:  Normal tongue strength and range of motion, no deviation or fasciculation.  MOTOR:  No atrophy, fasciculations or abnormal movements.  No pronator drift.  Tone is normal.    Right Upper Extremity:    Left Upper Extremity:    Deltoid  5/5   Deltoid  5/5   Biceps  5/5   Biceps  5/5   Triceps  5/5   Triceps  5/5   Wrist extensors  5/5   Wrist extensors  5/5   Wrist flexors  5/5   Wrist flexors  5/5   Finger extensors  5/5   Finger extensors  5/5   Finger flexors  5/5   Finger flexors  5/5   Dorsal interossei  5/5   Dorsal interossei  5/5   Abductor pollicis  4+/5   Abductor pollicis  5/5   Tone ([place] scale)  0  Tone ([place] scale)  0   Right Lower Extremity:    Left Lower Extremity:     Hip flexors  5/5   Hip flexors  5/5   Hip extensors  5/5   Hip extensors  5/5   Knee flexors  5/5   Knee flexors  5/5   Knee extensors  5/5   Knee extensors  5/5   Dorsiflexors  5/5   Dorsiflexors  5/5   Plantarflexors  5/5   Plantarflexors  5/5   Toe extensors  5/5   Toe extensors  5/5   Toe flexors  5/5   Toe flexors  5/5   Tone ([place] scale)  0  Tone ([place] scale)  0   MSRs:  Right                                                                 Left brachioradialis 2+  brachioradialis 3+  biceps 2+  biceps 3+  triceps 2+  triceps 2+  patellar 2+  patellar 2+  ankle jerk 2+  ankle jerk 2+  Hoffman no  Hoffman no  plantar response down  plantar response down   SENSORY:  Normal and symmetric perception of light touch, pinprick, vibration, and proprioception.  Tinel's sign is positive at the right wrist.  COORDINATION/GAIT: Normal finger-to- nose-finger.  Intact rapid alternating movements bilaterally.   Gait narrow based and stable.    IMPRESSION: 1.  Right carpal tunnel syndrome causing new right hand paresthesias.  With her history of repetitive activity of the hands, I encouraged her to start using a wrist splint and avoid hyperflexion of the wrist.  NCS/EMG of both arms will be performed to assess the severity of symptoms and evaluate whether there is a superimposed cervical radiculopathy.  2.  History of multilevel compressive cervical myelopathy with cord compression at C3-4 and stenosis at C3-4, C4-5, and C5-6 (2009) s/p ACDF with residual forearm paresthesias.  Explained that with the severity of her cord compression, she may not have complete neurological recovery and her paresthesias may be lasting deficits. 3.  Tobacco use disorder.   She is trying to quit and using gums as well as cutting back. She has been smoking since the  age of 52 and recently but back to 1ppd.  Smoking cessation instruction/counseling given:  counseled patient on the dangers of tobacco use,  advised patient to stop smoking, and reviewed strategies to maximize success  Further recommendations will be based on the results of her EMG  The duration of this appointment visit was 45 minutes of face-to-face time with the patient.  Greater than 50% of this time was spent in counseling, explanation of diagnosis, planning of further management, and coordination of care.   Thank you for allowing me to participate in patient's care.  If I can answer any additional questions, I would be pleased to do so.    Sincerely,    Donika K. Posey Pronto, DO

## 2016-09-28 NOTE — Patient Instructions (Addendum)
1.  NCS/EMG of both arms on December 7th at 10:15am.  Please arrive 15 minutes prior to appointment.   2.  Recommend you start using a wrist splint on the right hand as I suspect that your tingling is due to carpal tunnel syndrome.  3.  Avoid over flexion at the wrist  4.  Stop smoking

## 2016-09-29 ENCOUNTER — Ambulatory Visit (INDEPENDENT_AMBULATORY_CARE_PROVIDER_SITE_OTHER): Payer: Commercial Managed Care - HMO | Admitting: Neurology

## 2016-09-29 DIAGNOSIS — G5603 Carpal tunnel syndrome, bilateral upper limbs: Secondary | ICD-10-CM | POA: Diagnosis not present

## 2016-09-29 DIAGNOSIS — G5622 Lesion of ulnar nerve, left upper limb: Secondary | ICD-10-CM

## 2016-09-29 DIAGNOSIS — M5412 Radiculopathy, cervical region: Secondary | ICD-10-CM

## 2016-09-29 NOTE — Procedures (Signed)
[facility]  [place], [address]  [place], [place] [address] Tel: [phone] Fax:  [phone] Test Date:  09/29/2016  Patient: Christina Walsh DOB: 1955/11/13 Physician: Narda Amber, DO  Sex: Female Height: 5' " Ref Phys: Narda Amber, DO  ID#: [id number] Temp: 33.3C Technician: Jerilynn Mages. Dean   Patient Complaints: This is a 60 year old female with history of cervical spondylosis at C3-4, C4-5, and C5-6 s/p ACDF (2009) referred for evaluation of bilateral hand pain, numbness, weakness and tingling, worse on the left.  NCV & EMG Findings: Extensive electrodiagnostic testing of the left upper extremity and additional studies of the right shows:  1. Bilateral median and left ulnar sensory response is absent. Right ulnar sensory response is within normal limits. 2. Bilateral median motor responses show prolonged distal onset latency (L5.3, R4.8 ms) and reduced amplitude (L2.8, R1.4 mV).  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  Left ulnar motor shows decreased conduction velocity (A Elbow-B Elbow, L45, L43 m/s).  Right ulnar motor responses within normal limits. 3. Chronic motor axon loss changes are seen affecting bilateral abductor pollicis brevis muscles and C5-C6 myotomes.  Additionally on the left side. There are chronic motor axon loss changes affecting the ulnar innervated muscles. There is no evidence of accompanied active denervation.  Impression: 1. Bilateral median neuropathy at or distal to the wrist, consistent with the clinical diagnosis of carpal tunnel syndrome. Overall, these findings are severe in degree electrically. 2. Left ulnar neuropathy with slowing across the elbow, demyelinating and axon loss in type. 3. There are residual findings of an old C5-C6 radiculopathy affecting  bilateral upper extremities; moderate in degree electrically. 4. Incidentally, there is a Martin-Gruber anastomosis bilaterally, a normal variant.   ___________________________ Narda Amber, DO    Nerve Conduction Studies Anti Sensory Summary Table   Site NR Peak (ms) Norm Peak (ms) P-T Amp (V) Norm P-T Amp  Left Median Anti Sensory (2nd Digit)  33.3C  Wrist NR  <3.8  >10  Right Median Anti Sensory (2nd Digit)  33.3C  Wrist NR  <3.8  >10  Left Ulnar Anti Sensory (5th Digit)  33.3C  Wrist NR  <3.2  >5  Right Ulnar Anti Sensory (5th Digit)  33.3C  Wrist    3.2 <3.2 9.2 >5   Motor Summary Table   Site NR Onset (ms) Norm Onset (ms) O-P Amp (mV) Norm O-P Amp Site1 Site2 Delta-0 (ms) Dist (cm) Vel (m/s) Norm Vel (m/s)  Left Median Motor (Abd Poll Brev)  33.3C  Wrist    5.3 <4.0 2.8 >5 Elbow Wrist 2.2 20.0 91 >50  Elbow    7.5  2.6  Ulnar wrist-cfossover Elbow 3.1 0.0    Ulnar wrist-cfossover    4.4  4.4         Right Median Motor (Abd Poll Brev)  33.3C  Wrist    4.8 <4.0 1.4 >5 Elbow Wrist 4.2 25.0 60 >50  Elbow    9.0  1.1  ulnar-wrist crossove Elbow 3.2 0.0    ulnar-wrist crossove    5.8  2.6         Left Ulnar Motor (Abd Dig Minimi)  33.3C  Wrist    2.9 <3.1 7.5 >7 B Elbow Wrist 3.4 20.0 59 >50  B Elbow    6.3  5.0  A Elbow  B Elbow 2.2 10.0 45 >50  A Elbow    8.5  5.1         Right Ulnar Motor (Abd Dig Minimi)  33.3C  Wrist    3.1 <3.1 7.7 >7 B Elbow Wrist 4.2 24.0 57 >50  B Elbow    7.3  6.7  A Elbow B Elbow 1.6 10.0 62 >50  A Elbow    8.9  6.2         Left Ulnar (FDI) Motor (1st DI)  33.3C  Wrist    3.6 <4.5 13.3 >7 B Elbow Wrist 2.9 18.0 62 >50  B Elbow    6.5  12.0  A Elbow B Elbow 2.3 10.0 43 >50  A Elbow    8.8  10.9          EMG   Side Muscle Ins Act Fibs Psw Fasc Number Recrt Dur Dur. Amp Amp. Poly Poly. Comment  Left 1stDorInt Nml Nml Nml Nml Nml Nml Nml Nml Nml Nml Nml Nml N/A  Right 1stDorInt Nml Nml Nml Nml Nml Nml Nml Nml Nml Nml Nml Nml N/A    Right Abd Poll Brev Nml Nml Nml Nml 1- Rapid Some 1+ Some 1+ Nml Nml N/A  Right PronatorTeres Nml Nml Nml Nml 1- Mod-R Few 1+ Nml Nml Nml Nml N/A  Right Biceps Nml Nml Nml Nml 2- Rapid Many 1+ Many 1+ Some 1+ N/A  Right Triceps Nml Nml Nml Nml Nml Nml Nml Nml Nml Nml Nml Nml N/A  Right Deltoid Nml Nml Nml Nml 2- Rapid Many 1+ Many 1+ Nml Nml N/A  Left Abd Poll Brev Nml Nml Nml Nml 1- Rapid Some 1+ Some 1+ Nml Nml N/A  Left Ext Indicis Nml Nml Nml Nml Nml Nml Nml Nml Nml Nml Nml Nml N/A  Left PronatorTeres Nml Nml Nml Nml Nml Nml Nml Nml Nml Nml Nml Nml N/A  Left Biceps Nml Nml Nml Nml 1- Rapid Some 1+ Some 1+ Nml Nml N/A  Left Triceps Nml Nml Nml Nml Nml Nml Nml Nml Nml Nml Nml Nml N/A  Left Deltoid Nml Nml Nml Nml 2- Rapid Many 1+ Many 1+ Nml Nml N/A  Left ABD Dig Min Nml Nml Nml Nml 1- Rapid Some 1+ Some 1+ Nml Nml N/A  Left FlexCarpiUln Nml Nml Nml Nml 1- Rapid Few 1+ Few 1+ Nml Nml N/A      Waveforms:

## 2016-11-21 DIAGNOSIS — M25531 Pain in right wrist: Secondary | ICD-10-CM | POA: Diagnosis not present

## 2016-11-21 DIAGNOSIS — G5603 Carpal tunnel syndrome, bilateral upper limbs: Secondary | ICD-10-CM | POA: Diagnosis not present

## 2016-11-21 DIAGNOSIS — M25532 Pain in left wrist: Secondary | ICD-10-CM | POA: Diagnosis not present

## 2016-11-21 DIAGNOSIS — G5622 Lesion of ulnar nerve, left upper limb: Secondary | ICD-10-CM | POA: Diagnosis not present

## 2016-12-13 DIAGNOSIS — G5622 Lesion of ulnar nerve, left upper limb: Secondary | ICD-10-CM | POA: Diagnosis not present

## 2017-01-10 DIAGNOSIS — G8918 Other acute postprocedural pain: Secondary | ICD-10-CM | POA: Diagnosis not present

## 2017-01-10 DIAGNOSIS — G5622 Lesion of ulnar nerve, left upper limb: Secondary | ICD-10-CM | POA: Diagnosis not present

## 2017-01-10 DIAGNOSIS — G5602 Carpal tunnel syndrome, left upper limb: Secondary | ICD-10-CM | POA: Diagnosis not present

## 2017-01-16 ENCOUNTER — Other Ambulatory Visit: Payer: Self-pay | Admitting: Internal Medicine

## 2017-01-16 DIAGNOSIS — Z1389 Encounter for screening for other disorder: Secondary | ICD-10-CM | POA: Diagnosis not present

## 2017-01-16 DIAGNOSIS — G4733 Obstructive sleep apnea (adult) (pediatric): Secondary | ICD-10-CM | POA: Diagnosis not present

## 2017-01-16 DIAGNOSIS — J449 Chronic obstructive pulmonary disease, unspecified: Secondary | ICD-10-CM | POA: Diagnosis not present

## 2017-01-16 DIAGNOSIS — F17200 Nicotine dependence, unspecified, uncomplicated: Secondary | ICD-10-CM

## 2017-01-16 DIAGNOSIS — E669 Obesity, unspecified: Secondary | ICD-10-CM | POA: Diagnosis not present

## 2017-01-16 DIAGNOSIS — E7800 Pure hypercholesterolemia, unspecified: Secondary | ICD-10-CM | POA: Diagnosis not present

## 2017-01-16 DIAGNOSIS — Z0000 Encounter for general adult medical examination without abnormal findings: Secondary | ICD-10-CM | POA: Diagnosis not present

## 2017-01-16 DIAGNOSIS — Z1159 Encounter for screening for other viral diseases: Secondary | ICD-10-CM | POA: Diagnosis not present

## 2017-01-16 DIAGNOSIS — F17210 Nicotine dependence, cigarettes, uncomplicated: Secondary | ICD-10-CM | POA: Diagnosis not present

## 2017-01-16 DIAGNOSIS — F325 Major depressive disorder, single episode, in full remission: Secondary | ICD-10-CM | POA: Diagnosis not present

## 2017-01-16 DIAGNOSIS — I10 Essential (primary) hypertension: Secondary | ICD-10-CM | POA: Diagnosis not present

## 2017-01-20 ENCOUNTER — Ambulatory Visit
Admission: RE | Admit: 2017-01-20 | Discharge: 2017-01-20 | Disposition: A | Discharge status: Home / Self Care | Payer: Commercial Managed Care - HMO | Source: Ambulatory Visit | Attending: Internal Medicine | Admitting: Internal Medicine

## 2017-01-20 DIAGNOSIS — F17210 Nicotine dependence, cigarettes, uncomplicated: Secondary | ICD-10-CM | POA: Diagnosis not present

## 2017-01-20 DIAGNOSIS — F17200 Nicotine dependence, unspecified, uncomplicated: Secondary | ICD-10-CM

## 2017-01-24 DIAGNOSIS — M25532 Pain in left wrist: Secondary | ICD-10-CM | POA: Diagnosis not present

## 2017-01-27 ENCOUNTER — Other Ambulatory Visit: Payer: Self-pay | Admitting: Internal Medicine

## 2017-01-27 DIAGNOSIS — Z1231 Encounter for screening mammogram for malignant neoplasm of breast: Secondary | ICD-10-CM

## 2017-01-31 DIAGNOSIS — M25532 Pain in left wrist: Secondary | ICD-10-CM | POA: Diagnosis not present

## 2017-02-01 ENCOUNTER — Ambulatory Visit: Payer: Commercial Managed Care - HMO

## 2017-02-08 DIAGNOSIS — M25532 Pain in left wrist: Secondary | ICD-10-CM | POA: Diagnosis not present

## 2017-02-24 ENCOUNTER — Ambulatory Visit
Admission: RE | Admit: 2017-02-24 | Discharge: 2017-02-24 | Disposition: A | Discharge status: Home / Self Care | Payer: Commercial Managed Care - HMO | Source: Ambulatory Visit | Attending: Internal Medicine | Admitting: Internal Medicine

## 2017-02-24 DIAGNOSIS — Z1231 Encounter for screening mammogram for malignant neoplasm of breast: Secondary | ICD-10-CM | POA: Diagnosis not present

## 2017-02-27 ENCOUNTER — Other Ambulatory Visit: Payer: Self-pay | Admitting: Internal Medicine

## 2017-02-27 DIAGNOSIS — R928 Other abnormal and inconclusive findings on diagnostic imaging of breast: Secondary | ICD-10-CM

## 2017-03-07 ENCOUNTER — Other Ambulatory Visit: Payer: Self-pay | Admitting: Internal Medicine

## 2017-03-07 ENCOUNTER — Ambulatory Visit
Admission: RE | Admit: 2017-03-07 | Discharge: 2017-03-07 | Disposition: A | Discharge status: Home / Self Care | Payer: Commercial Managed Care - HMO | Source: Ambulatory Visit | Attending: Internal Medicine | Admitting: Internal Medicine

## 2017-03-07 DIAGNOSIS — H5203 Hypermetropia, bilateral: Secondary | ICD-10-CM | POA: Diagnosis not present

## 2017-03-07 DIAGNOSIS — R921 Mammographic calcification found on diagnostic imaging of breast: Secondary | ICD-10-CM

## 2017-03-07 DIAGNOSIS — H25011 Cortical age-related cataract, right eye: Secondary | ICD-10-CM | POA: Diagnosis not present

## 2017-03-07 DIAGNOSIS — H2513 Age-related nuclear cataract, bilateral: Secondary | ICD-10-CM | POA: Diagnosis not present

## 2017-03-07 DIAGNOSIS — R928 Other abnormal and inconclusive findings on diagnostic imaging of breast: Secondary | ICD-10-CM

## 2017-03-10 ENCOUNTER — Ambulatory Visit
Admission: RE | Admit: 2017-03-10 | Discharge: 2017-03-10 | Disposition: A | Discharge status: Home / Self Care | Payer: Commercial Managed Care - HMO | Source: Ambulatory Visit | Attending: Internal Medicine | Admitting: Internal Medicine

## 2017-03-10 DIAGNOSIS — R921 Mammographic calcification found on diagnostic imaging of breast: Secondary | ICD-10-CM

## 2017-03-10 DIAGNOSIS — D242 Benign neoplasm of left breast: Secondary | ICD-10-CM | POA: Diagnosis not present

## 2017-04-03 DIAGNOSIS — M1610 Unilateral primary osteoarthritis, unspecified hip: Secondary | ICD-10-CM | POA: Diagnosis not present

## 2017-04-03 DIAGNOSIS — R21 Rash and other nonspecific skin eruption: Secondary | ICD-10-CM | POA: Diagnosis not present

## 2017-04-03 DIAGNOSIS — L309 Dermatitis, unspecified: Secondary | ICD-10-CM | POA: Diagnosis not present

## 2017-04-04 ENCOUNTER — Other Ambulatory Visit: Payer: Self-pay | Admitting: Internal Medicine

## 2017-04-04 ENCOUNTER — Ambulatory Visit
Admission: RE | Admit: 2017-04-04 | Discharge: 2017-04-04 | Disposition: A | Discharge status: Home / Self Care | Payer: Medicare HMO | Source: Ambulatory Visit | Attending: Internal Medicine | Admitting: Internal Medicine

## 2017-04-04 DIAGNOSIS — M1610 Unilateral primary osteoarthritis, unspecified hip: Secondary | ICD-10-CM

## 2017-04-04 DIAGNOSIS — M1612 Unilateral primary osteoarthritis, left hip: Secondary | ICD-10-CM | POA: Diagnosis not present

## 2017-05-02 DIAGNOSIS — L2089 Other atopic dermatitis: Secondary | ICD-10-CM | POA: Diagnosis not present

## 2017-05-05 IMAGING — CT CT CHEST LUNG CANCER SCREENING LOW DOSE W/O CM
1 of 5 series · 15 of 40 positions shown, 19 images · non-contrast
Comparison: 07/13/2009

CLINICAL DATA: Lung cancer screening.  Current asymptomatic smoker.

EXAM:
CT CHEST WITHOUT CONTRAST LOW-DOSE FOR LUNG CANCER SCREENING
TECHNIQUE: Multidetector CT imaging of the chest was performed following the
standard protocol without IV contrast.

[Series 3: lung windows · axial · 0.70mm/px · z∈[-258,-9]mm · 15 of 221 slices shown, 19 images]
[im 11/221  mediastinal]
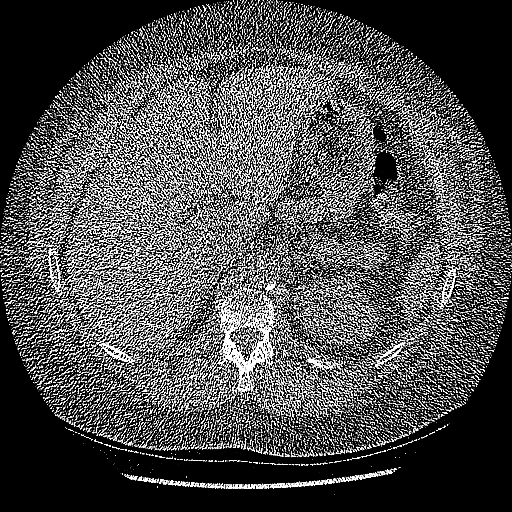
[im 11/221  lung]
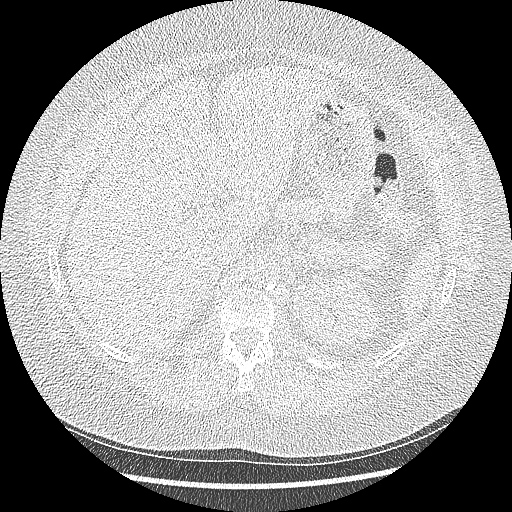
[im 32/221  lung]
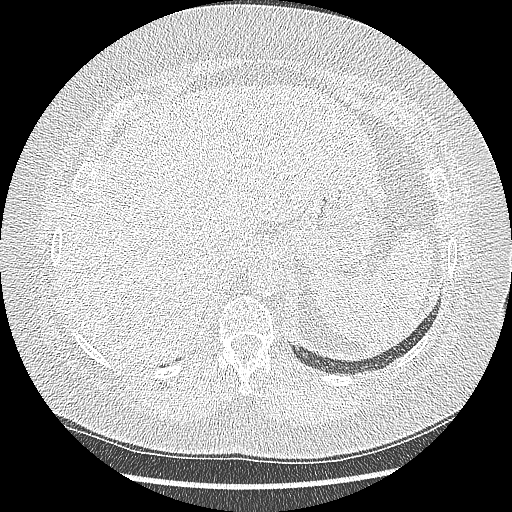
[im 42/221  lung]
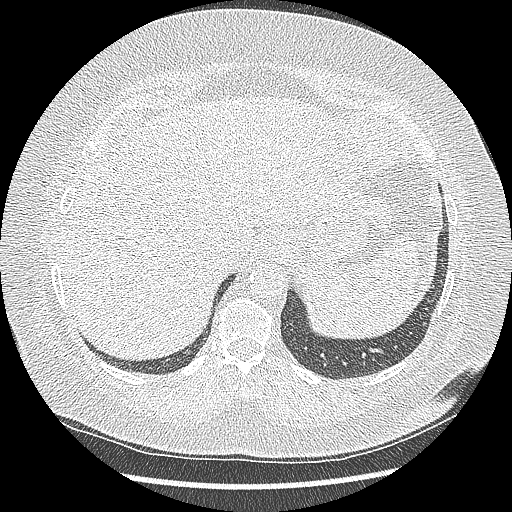
[im 53/221  lung]
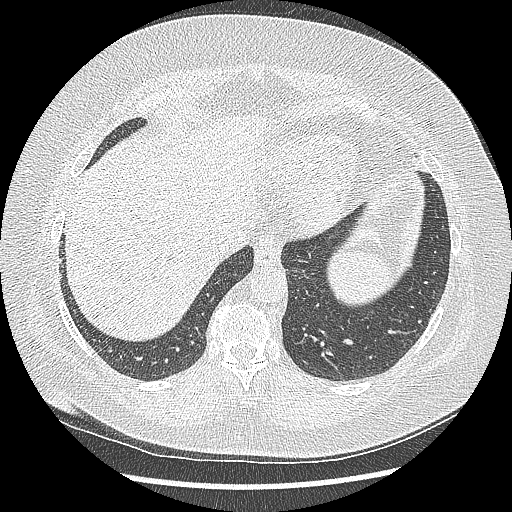
[im 74/221  mediastinal]
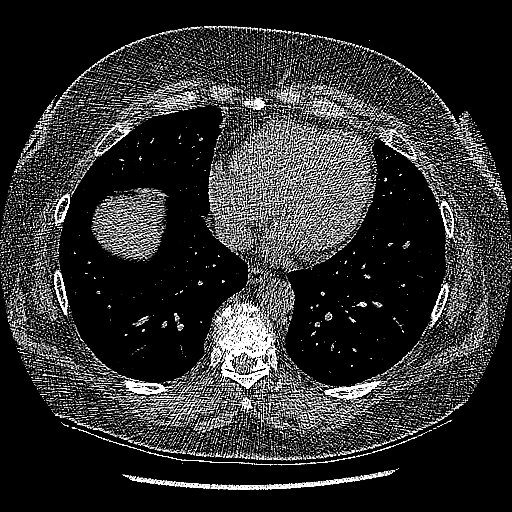
[im 74/221  lung]
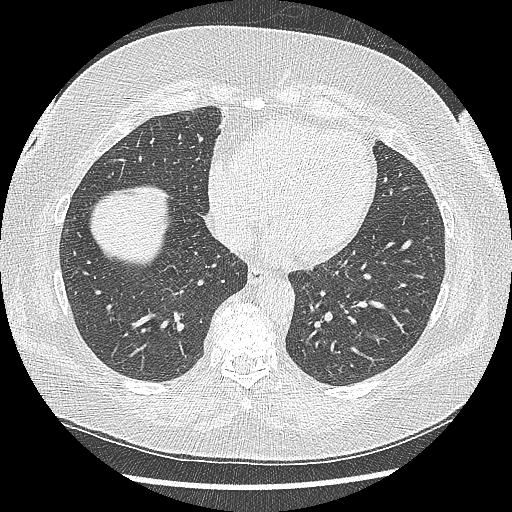
[im 84/221  lung]
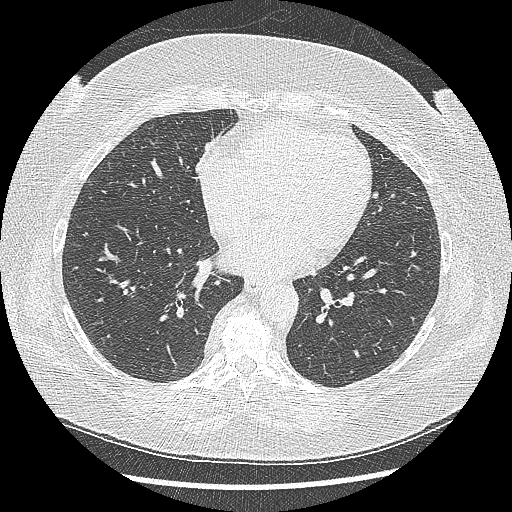
[im 95/221  lung]
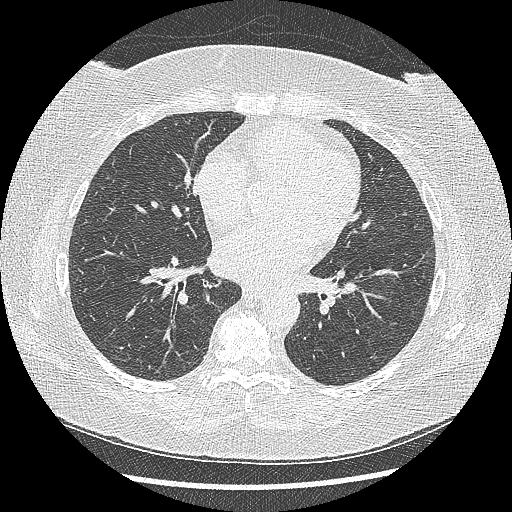
[im 116/221  lung]
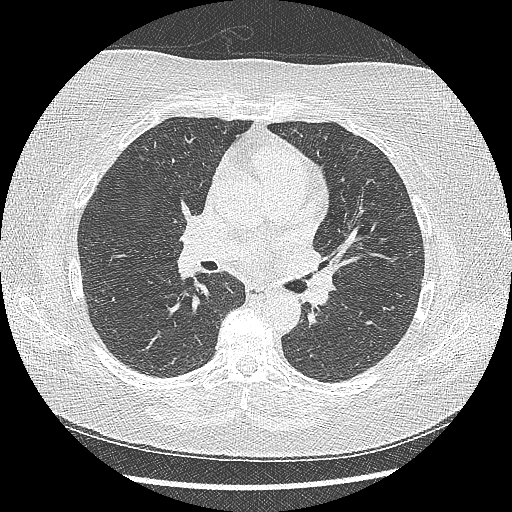
[im 126/221  mediastinal]
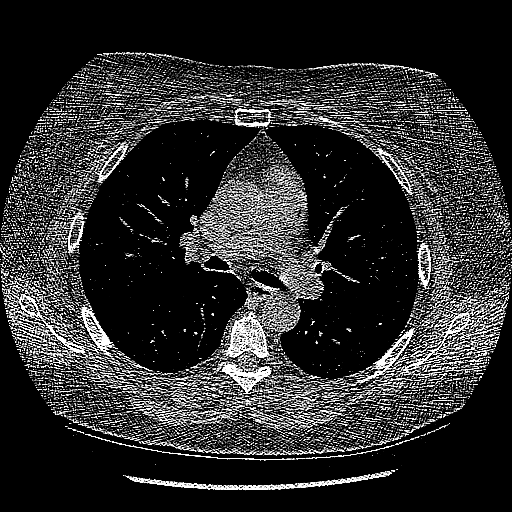
[im 126/221  lung]
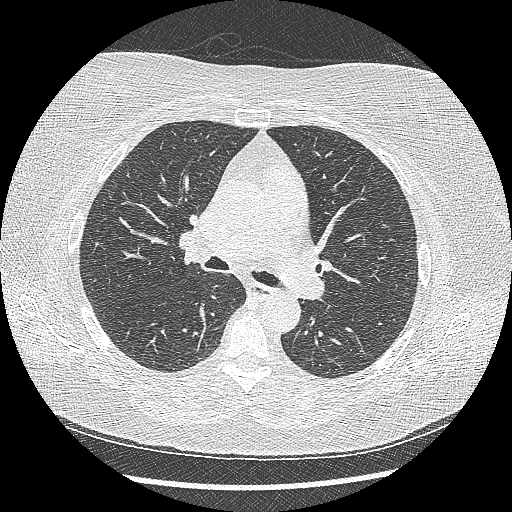
[im 137/221  lung]
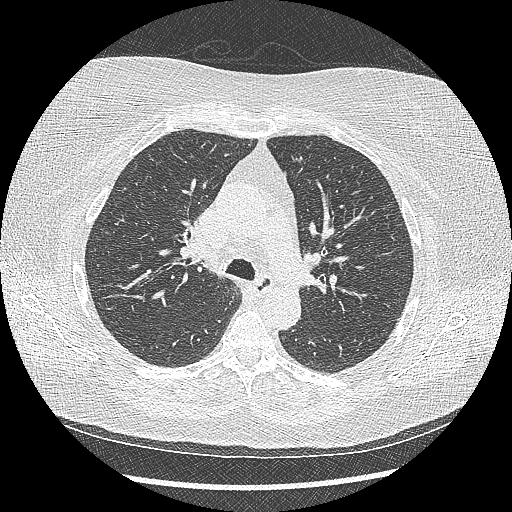
[im 158/221  lung]
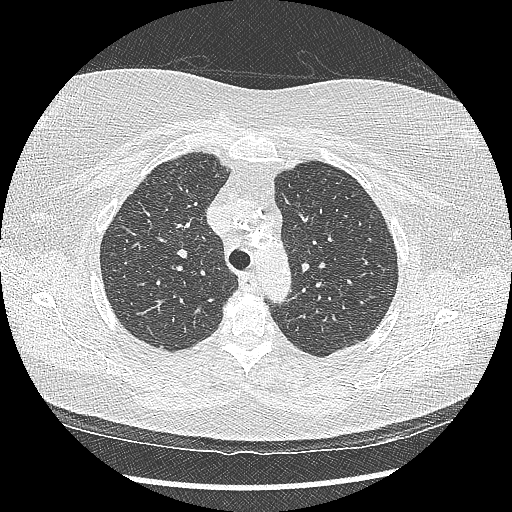
[im 168/221  lung]
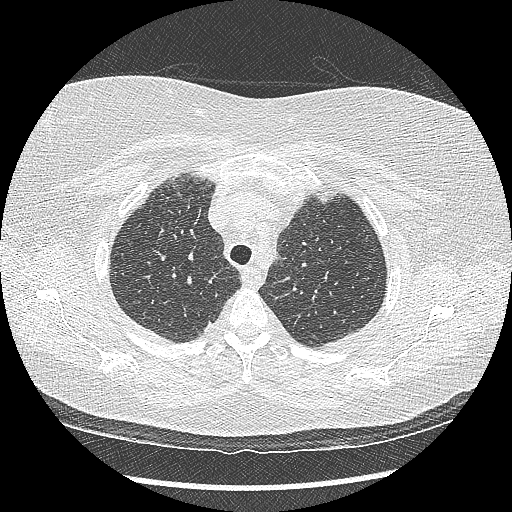
[im 179/221  mediastinal]
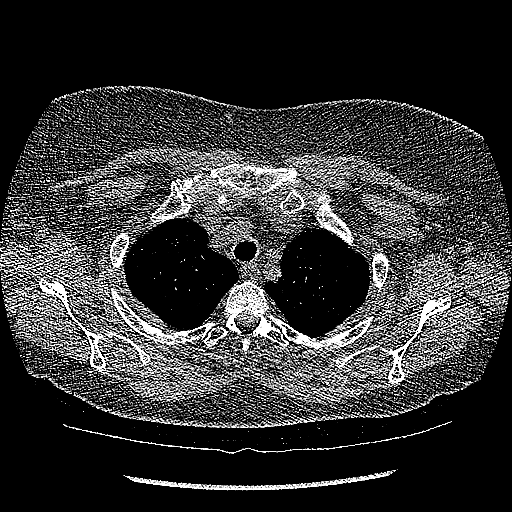
[im 179/221  lung]
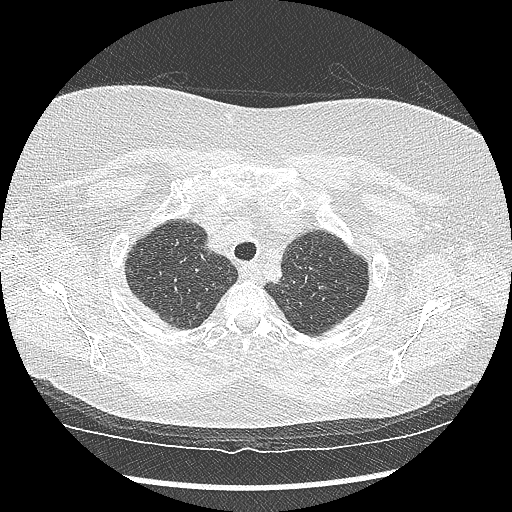
[im 200/221  lung]
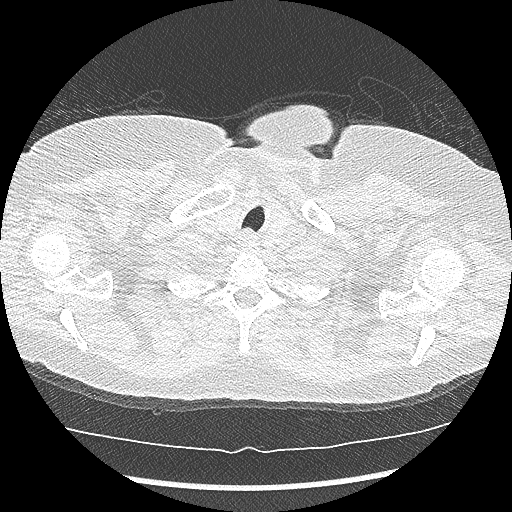
[im 210/221  lung]
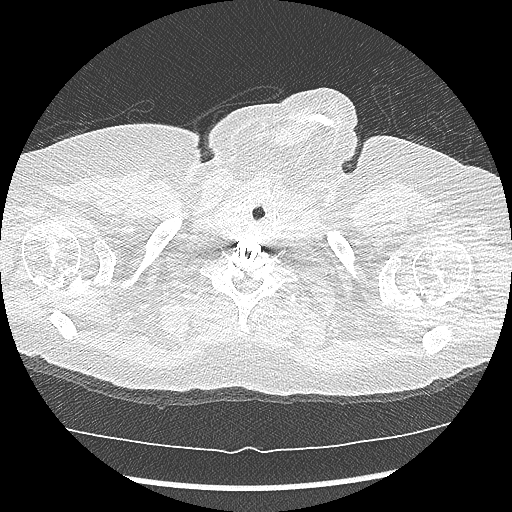

[15 of 40 positions shown; findings below may reference images not displayed]

FINDINGS: Cardiovascular: The heart size is normal. There is no pericardial
effusion identified. Aortic atherosclerosis.

Mediastinum/Nodes: No enlarged mediastinal, hilar, or axillary lymph
nodes. Thyroid gland, trachea, and esophagus demonstrate no
significant findings.

Lungs/Pleura: Moderate changes of centrilobular emphysema. Diffuse
bronchial wall thickening noted. Right middle lobe lung nodule has
an equivalent diameter of 3.4 mm.

Upper Abdomen: No acute abnormality.

Musculoskeletal: Degenerative disc disease is identified within the
thoracic spine.
IMPRESSION: 1. Lung-RADS Category 2, benign appearance or behavior. Continue
annual screening with low-dose chest CT without contrast in 12
months
2. Diffuse bronchial wall thickening with emphysema, as above;
imaging findings suggestive of underlying COPD.
3. Aortic atherosclerosis.

## 2017-06-01 DIAGNOSIS — J441 Chronic obstructive pulmonary disease with (acute) exacerbation: Secondary | ICD-10-CM | POA: Diagnosis not present

## 2017-07-19 ENCOUNTER — Other Ambulatory Visit (HOSPITAL_COMMUNITY)
Admission: RE | Admit: 2017-07-19 | Discharge: 2017-07-19 | Disposition: A | Discharge status: Home / Self Care | Payer: Medicare HMO | Source: Ambulatory Visit | Attending: Internal Medicine | Admitting: Internal Medicine

## 2017-07-19 ENCOUNTER — Other Ambulatory Visit: Payer: Self-pay | Admitting: Internal Medicine

## 2017-07-19 DIAGNOSIS — I10 Essential (primary) hypertension: Secondary | ICD-10-CM | POA: Diagnosis not present

## 2017-07-19 DIAGNOSIS — L299 Pruritus, unspecified: Secondary | ICD-10-CM | POA: Diagnosis not present

## 2017-07-19 DIAGNOSIS — L309 Dermatitis, unspecified: Secondary | ICD-10-CM | POA: Diagnosis not present

## 2017-07-19 DIAGNOSIS — Z124 Encounter for screening for malignant neoplasm of cervix: Secondary | ICD-10-CM | POA: Diagnosis not present

## 2017-07-19 DIAGNOSIS — G4733 Obstructive sleep apnea (adult) (pediatric): Secondary | ICD-10-CM | POA: Diagnosis not present

## 2017-07-19 DIAGNOSIS — F325 Major depressive disorder, single episode, in full remission: Secondary | ICD-10-CM | POA: Diagnosis not present

## 2017-07-19 DIAGNOSIS — Z01419 Encounter for gynecological examination (general) (routine) without abnormal findings: Secondary | ICD-10-CM | POA: Diagnosis not present

## 2017-07-21 LAB — CYTOLOGY - PAP
DIAGNOSIS: NEGATIVE
HPV (WINDOPATH): NOT DETECTED

## 2017-08-04 DIAGNOSIS — L245 Irritant contact dermatitis due to other chemical products: Secondary | ICD-10-CM | POA: Diagnosis not present

## 2017-09-04 DIAGNOSIS — R21 Rash and other nonspecific skin eruption: Secondary | ICD-10-CM | POA: Diagnosis not present

## 2017-09-20 ENCOUNTER — Other Ambulatory Visit: Payer: Self-pay | Admitting: Pharmacist

## 2017-09-20 NOTE — Patient Outreach (Signed)
Incoming call from Garvin Fila in response to the [facility] Medication Adherence Campaign. Speak with patient. HIPAA identifiers verified and verbal consent received.  Ms. Leary Roca reports that she takes each of her blood pressure medications as directed. Patient reports that she misses a dose of her medications a couple of times every other week. Patient reports that she misses doses because she forgets to take her medication. Reports that sometimes she just doesn't remember if she's already taken her medication on a particular day. Counsel patient on the importance of medication adherence and strategies to aid with adherence. Discuss with patient the use of a pillbox. Patient reports that she filled a weekly pillbox for her father in the past. Patient reports that she thinks that this would be a good idea and that she will get one from [facility].  Patient reports that she currently uses [facility] mail order pharmacy for cost savings. Ms. Gagner denies any further medication questions/concerns at this time. Provide patient with my phone number.  Harlow Asa, PharmD, [facility] [phone]

## 2017-11-17 DIAGNOSIS — J441 Chronic obstructive pulmonary disease with (acute) exacerbation: Secondary | ICD-10-CM | POA: Diagnosis not present

## 2017-11-27 DIAGNOSIS — G4733 Obstructive sleep apnea (adult) (pediatric): Secondary | ICD-10-CM | POA: Diagnosis not present

## 2017-12-05 DIAGNOSIS — J441 Chronic obstructive pulmonary disease with (acute) exacerbation: Secondary | ICD-10-CM | POA: Diagnosis not present

## 2017-12-06 DIAGNOSIS — G5601 Carpal tunnel syndrome, right upper limb: Secondary | ICD-10-CM | POA: Diagnosis not present

## 2017-12-06 DIAGNOSIS — M79641 Pain in right hand: Secondary | ICD-10-CM | POA: Diagnosis not present

## 2018-01-08 DIAGNOSIS — J441 Chronic obstructive pulmonary disease with (acute) exacerbation: Secondary | ICD-10-CM | POA: Diagnosis not present

## 2018-01-08 DIAGNOSIS — I10 Essential (primary) hypertension: Secondary | ICD-10-CM | POA: Diagnosis not present

## 2018-01-12 DIAGNOSIS — G5601 Carpal tunnel syndrome, right upper limb: Secondary | ICD-10-CM | POA: Diagnosis not present

## 2018-01-25 DIAGNOSIS — M79641 Pain in right hand: Secondary | ICD-10-CM | POA: Diagnosis not present

## 2018-01-26 DIAGNOSIS — Z7189 Other specified counseling: Secondary | ICD-10-CM | POA: Diagnosis not present

## 2018-01-26 DIAGNOSIS — F325 Major depressive disorder, single episode, in full remission: Secondary | ICD-10-CM | POA: Diagnosis not present

## 2018-01-26 DIAGNOSIS — I10 Essential (primary) hypertension: Secondary | ICD-10-CM | POA: Diagnosis not present

## 2018-01-26 DIAGNOSIS — K219 Gastro-esophageal reflux disease without esophagitis: Secondary | ICD-10-CM | POA: Diagnosis not present

## 2018-01-26 DIAGNOSIS — Z0000 Encounter for general adult medical examination without abnormal findings: Secondary | ICD-10-CM | POA: Diagnosis not present

## 2018-01-26 DIAGNOSIS — Z1389 Encounter for screening for other disorder: Secondary | ICD-10-CM | POA: Diagnosis not present

## 2018-01-26 DIAGNOSIS — J449 Chronic obstructive pulmonary disease, unspecified: Secondary | ICD-10-CM | POA: Diagnosis not present

## 2018-01-26 DIAGNOSIS — J301 Allergic rhinitis due to pollen: Secondary | ICD-10-CM | POA: Diagnosis not present

## 2018-04-10 ENCOUNTER — Other Ambulatory Visit: Payer: Self-pay | Admitting: Internal Medicine

## 2018-04-10 DIAGNOSIS — Z1231 Encounter for screening mammogram for malignant neoplasm of breast: Secondary | ICD-10-CM

## 2018-04-11 ENCOUNTER — Ambulatory Visit
Admission: RE | Admit: 2018-04-11 | Discharge: 2018-04-11 | Disposition: A | Discharge status: Home / Self Care | Payer: Medicare HMO | Source: Ambulatory Visit | Attending: Internal Medicine | Admitting: Internal Medicine

## 2018-04-11 DIAGNOSIS — Z1231 Encounter for screening mammogram for malignant neoplasm of breast: Secondary | ICD-10-CM | POA: Diagnosis not present

## 2018-05-21 DIAGNOSIS — F325 Major depressive disorder, single episode, in full remission: Secondary | ICD-10-CM | POA: Diagnosis not present

## 2018-05-21 DIAGNOSIS — I10 Essential (primary) hypertension: Secondary | ICD-10-CM | POA: Diagnosis not present

## 2018-05-21 DIAGNOSIS — J441 Chronic obstructive pulmonary disease with (acute) exacerbation: Secondary | ICD-10-CM | POA: Diagnosis not present

## 2018-05-21 DIAGNOSIS — G459 Transient cerebral ischemic attack, unspecified: Secondary | ICD-10-CM | POA: Diagnosis not present

## 2018-05-21 DIAGNOSIS — M1610 Unilateral primary osteoarthritis, unspecified hip: Secondary | ICD-10-CM | POA: Diagnosis not present

## 2018-06-29 DIAGNOSIS — F325 Major depressive disorder, single episode, in full remission: Secondary | ICD-10-CM | POA: Diagnosis not present

## 2018-06-29 DIAGNOSIS — J449 Chronic obstructive pulmonary disease, unspecified: Secondary | ICD-10-CM | POA: Diagnosis not present

## 2018-06-29 DIAGNOSIS — I10 Essential (primary) hypertension: Secondary | ICD-10-CM | POA: Diagnosis not present

## 2018-06-29 DIAGNOSIS — G459 Transient cerebral ischemic attack, unspecified: Secondary | ICD-10-CM | POA: Diagnosis not present

## 2018-06-29 DIAGNOSIS — M1610 Unilateral primary osteoarthritis, unspecified hip: Secondary | ICD-10-CM | POA: Diagnosis not present

## 2018-07-31 DIAGNOSIS — J449 Chronic obstructive pulmonary disease, unspecified: Secondary | ICD-10-CM | POA: Diagnosis not present

## 2018-07-31 DIAGNOSIS — F17200 Nicotine dependence, unspecified, uncomplicated: Secondary | ICD-10-CM | POA: Diagnosis not present

## 2018-07-31 DIAGNOSIS — I10 Essential (primary) hypertension: Secondary | ICD-10-CM | POA: Diagnosis not present

## 2018-07-31 DIAGNOSIS — F325 Major depressive disorder, single episode, in full remission: Secondary | ICD-10-CM | POA: Diagnosis not present

## 2018-07-31 DIAGNOSIS — K219 Gastro-esophageal reflux disease without esophagitis: Secondary | ICD-10-CM | POA: Diagnosis not present

## 2018-07-31 DIAGNOSIS — Z23 Encounter for immunization: Secondary | ICD-10-CM | POA: Diagnosis not present

## 2018-08-27 DIAGNOSIS — J449 Chronic obstructive pulmonary disease, unspecified: Secondary | ICD-10-CM | POA: Diagnosis not present

## 2018-08-30 DIAGNOSIS — J441 Chronic obstructive pulmonary disease with (acute) exacerbation: Secondary | ICD-10-CM | POA: Diagnosis not present

## 2018-09-19 DIAGNOSIS — I10 Essential (primary) hypertension: Secondary | ICD-10-CM | POA: Diagnosis not present

## 2018-09-19 DIAGNOSIS — G459 Transient cerebral ischemic attack, unspecified: Secondary | ICD-10-CM | POA: Diagnosis not present

## 2018-09-19 DIAGNOSIS — F325 Major depressive disorder, single episode, in full remission: Secondary | ICD-10-CM | POA: Diagnosis not present

## 2018-09-19 DIAGNOSIS — M1610 Unilateral primary osteoarthritis, unspecified hip: Secondary | ICD-10-CM | POA: Diagnosis not present

## 2018-09-19 DIAGNOSIS — J449 Chronic obstructive pulmonary disease, unspecified: Secondary | ICD-10-CM | POA: Diagnosis not present

## 2018-09-19 DIAGNOSIS — J441 Chronic obstructive pulmonary disease with (acute) exacerbation: Secondary | ICD-10-CM | POA: Diagnosis not present

## 2018-10-04 DIAGNOSIS — J449 Chronic obstructive pulmonary disease, unspecified: Secondary | ICD-10-CM | POA: Diagnosis not present

## 2018-10-04 DIAGNOSIS — R21 Rash and other nonspecific skin eruption: Secondary | ICD-10-CM | POA: Diagnosis not present

## 2018-10-04 DIAGNOSIS — J309 Allergic rhinitis, unspecified: Secondary | ICD-10-CM | POA: Diagnosis not present

## 2018-10-04 DIAGNOSIS — L501 Idiopathic urticaria: Secondary | ICD-10-CM | POA: Diagnosis not present

## 2018-10-10 DIAGNOSIS — F325 Major depressive disorder, single episode, in full remission: Secondary | ICD-10-CM | POA: Diagnosis not present

## 2018-10-10 DIAGNOSIS — G459 Transient cerebral ischemic attack, unspecified: Secondary | ICD-10-CM | POA: Diagnosis not present

## 2018-10-10 DIAGNOSIS — M1610 Unilateral primary osteoarthritis, unspecified hip: Secondary | ICD-10-CM | POA: Diagnosis not present

## 2018-10-10 DIAGNOSIS — I10 Essential (primary) hypertension: Secondary | ICD-10-CM | POA: Diagnosis not present

## 2018-10-10 DIAGNOSIS — J441 Chronic obstructive pulmonary disease with (acute) exacerbation: Secondary | ICD-10-CM | POA: Diagnosis not present

## 2018-10-10 DIAGNOSIS — J449 Chronic obstructive pulmonary disease, unspecified: Secondary | ICD-10-CM | POA: Diagnosis not present

## 2018-10-25 DIAGNOSIS — H5203 Hypermetropia, bilateral: Secondary | ICD-10-CM | POA: Diagnosis not present

## 2018-10-25 DIAGNOSIS — H25013 Cortical age-related cataract, bilateral: Secondary | ICD-10-CM | POA: Diagnosis not present

## 2018-10-25 DIAGNOSIS — H52203 Unspecified astigmatism, bilateral: Secondary | ICD-10-CM | POA: Diagnosis not present

## 2018-10-25 DIAGNOSIS — H2513 Age-related nuclear cataract, bilateral: Secondary | ICD-10-CM | POA: Diagnosis not present

## 2018-10-31 DIAGNOSIS — J449 Chronic obstructive pulmonary disease, unspecified: Secondary | ICD-10-CM | POA: Diagnosis not present

## 2018-11-01 DIAGNOSIS — S61203A Unspecified open wound of left middle finger without damage to nail, initial encounter: Secondary | ICD-10-CM | POA: Diagnosis not present

## 2018-12-14 DIAGNOSIS — G459 Transient cerebral ischemic attack, unspecified: Secondary | ICD-10-CM | POA: Diagnosis not present

## 2018-12-14 DIAGNOSIS — J449 Chronic obstructive pulmonary disease, unspecified: Secondary | ICD-10-CM | POA: Diagnosis not present

## 2018-12-14 DIAGNOSIS — M1610 Unilateral primary osteoarthritis, unspecified hip: Secondary | ICD-10-CM | POA: Diagnosis not present

## 2018-12-14 DIAGNOSIS — J441 Chronic obstructive pulmonary disease with (acute) exacerbation: Secondary | ICD-10-CM | POA: Diagnosis not present

## 2018-12-14 DIAGNOSIS — I10 Essential (primary) hypertension: Secondary | ICD-10-CM | POA: Diagnosis not present

## 2018-12-14 DIAGNOSIS — F325 Major depressive disorder, single episode, in full remission: Secondary | ICD-10-CM | POA: Diagnosis not present

## 2019-01-22 DIAGNOSIS — M1610 Unilateral primary osteoarthritis, unspecified hip: Secondary | ICD-10-CM | POA: Diagnosis not present

## 2019-01-22 DIAGNOSIS — G459 Transient cerebral ischemic attack, unspecified: Secondary | ICD-10-CM | POA: Diagnosis not present

## 2019-01-22 DIAGNOSIS — I10 Essential (primary) hypertension: Secondary | ICD-10-CM | POA: Diagnosis not present

## 2019-01-22 DIAGNOSIS — F325 Major depressive disorder, single episode, in full remission: Secondary | ICD-10-CM | POA: Diagnosis not present

## 2019-01-22 DIAGNOSIS — J449 Chronic obstructive pulmonary disease, unspecified: Secondary | ICD-10-CM | POA: Diagnosis not present

## 2019-01-22 DIAGNOSIS — J441 Chronic obstructive pulmonary disease with (acute) exacerbation: Secondary | ICD-10-CM | POA: Diagnosis not present

## 2019-02-05 DIAGNOSIS — Z0000 Encounter for general adult medical examination without abnormal findings: Secondary | ICD-10-CM | POA: Diagnosis not present

## 2019-02-05 DIAGNOSIS — F1021 Alcohol dependence, in remission: Secondary | ICD-10-CM | POA: Diagnosis not present

## 2019-02-05 DIAGNOSIS — K219 Gastro-esophageal reflux disease without esophagitis: Secondary | ICD-10-CM | POA: Diagnosis not present

## 2019-02-05 DIAGNOSIS — Z1389 Encounter for screening for other disorder: Secondary | ICD-10-CM | POA: Diagnosis not present

## 2019-02-05 DIAGNOSIS — F3341 Major depressive disorder, recurrent, in partial remission: Secondary | ICD-10-CM | POA: Diagnosis not present

## 2019-02-05 DIAGNOSIS — I10 Essential (primary) hypertension: Secondary | ICD-10-CM | POA: Diagnosis not present

## 2019-02-05 DIAGNOSIS — J449 Chronic obstructive pulmonary disease, unspecified: Secondary | ICD-10-CM | POA: Diagnosis not present

## 2019-03-04 DIAGNOSIS — F325 Major depressive disorder, single episode, in full remission: Secondary | ICD-10-CM | POA: Diagnosis not present

## 2019-04-10 DIAGNOSIS — J449 Chronic obstructive pulmonary disease, unspecified: Secondary | ICD-10-CM | POA: Diagnosis not present

## 2019-04-10 DIAGNOSIS — G459 Transient cerebral ischemic attack, unspecified: Secondary | ICD-10-CM | POA: Diagnosis not present

## 2019-04-10 DIAGNOSIS — J441 Chronic obstructive pulmonary disease with (acute) exacerbation: Secondary | ICD-10-CM | POA: Diagnosis not present

## 2019-04-10 DIAGNOSIS — M1610 Unilateral primary osteoarthritis, unspecified hip: Secondary | ICD-10-CM | POA: Diagnosis not present

## 2019-04-10 DIAGNOSIS — F325 Major depressive disorder, single episode, in full remission: Secondary | ICD-10-CM | POA: Diagnosis not present

## 2019-04-10 DIAGNOSIS — F3341 Major depressive disorder, recurrent, in partial remission: Secondary | ICD-10-CM | POA: Diagnosis not present

## 2019-04-10 DIAGNOSIS — I10 Essential (primary) hypertension: Secondary | ICD-10-CM | POA: Diagnosis not present

## 2019-04-15 DIAGNOSIS — G4733 Obstructive sleep apnea (adult) (pediatric): Secondary | ICD-10-CM | POA: Diagnosis not present

## 2019-04-16 ENCOUNTER — Other Ambulatory Visit: Payer: Self-pay | Admitting: Internal Medicine

## 2019-04-16 DIAGNOSIS — Z1231 Encounter for screening mammogram for malignant neoplasm of breast: Secondary | ICD-10-CM

## 2019-05-21 ENCOUNTER — Other Ambulatory Visit: Payer: Self-pay | Admitting: Internal Medicine

## 2019-05-21 DIAGNOSIS — Z122 Encounter for screening for malignant neoplasm of respiratory organs: Secondary | ICD-10-CM

## 2019-05-24 DIAGNOSIS — G459 Transient cerebral ischemic attack, unspecified: Secondary | ICD-10-CM | POA: Diagnosis not present

## 2019-05-24 DIAGNOSIS — F3341 Major depressive disorder, recurrent, in partial remission: Secondary | ICD-10-CM | POA: Diagnosis not present

## 2019-05-24 DIAGNOSIS — F325 Major depressive disorder, single episode, in full remission: Secondary | ICD-10-CM | POA: Diagnosis not present

## 2019-05-24 DIAGNOSIS — J449 Chronic obstructive pulmonary disease, unspecified: Secondary | ICD-10-CM | POA: Diagnosis not present

## 2019-05-24 DIAGNOSIS — J441 Chronic obstructive pulmonary disease with (acute) exacerbation: Secondary | ICD-10-CM | POA: Diagnosis not present

## 2019-05-24 DIAGNOSIS — I10 Essential (primary) hypertension: Secondary | ICD-10-CM | POA: Diagnosis not present

## 2019-05-24 DIAGNOSIS — M1610 Unilateral primary osteoarthritis, unspecified hip: Secondary | ICD-10-CM | POA: Diagnosis not present

## 2019-05-27 DIAGNOSIS — R69 Illness, unspecified: Secondary | ICD-10-CM | POA: Diagnosis not present

## 2019-05-31 ENCOUNTER — Ambulatory Visit
Admission: RE | Admit: 2019-05-31 | Discharge: 2019-05-31 | Disposition: A | Discharge status: Home / Self Care | Payer: Medicare HMO | Source: Ambulatory Visit | Attending: Internal Medicine | Admitting: Internal Medicine

## 2019-05-31 ENCOUNTER — Other Ambulatory Visit: Payer: Self-pay

## 2019-05-31 DIAGNOSIS — Z1231 Encounter for screening mammogram for malignant neoplasm of breast: Secondary | ICD-10-CM | POA: Diagnosis not present

## 2019-06-05 ENCOUNTER — Ambulatory Visit
Admission: RE | Admit: 2019-06-05 | Discharge: 2019-06-05 | Disposition: A | Discharge status: Home / Self Care | Payer: Medicare HMO | Source: Ambulatory Visit | Attending: Internal Medicine | Admitting: Internal Medicine

## 2019-06-05 DIAGNOSIS — Z122 Encounter for screening for malignant neoplasm of respiratory organs: Secondary | ICD-10-CM

## 2019-06-05 DIAGNOSIS — Z87891 Personal history of nicotine dependence: Secondary | ICD-10-CM | POA: Diagnosis not present

## 2019-06-06 DIAGNOSIS — F325 Major depressive disorder, single episode, in full remission: Secondary | ICD-10-CM | POA: Diagnosis not present

## 2019-06-06 DIAGNOSIS — J449 Chronic obstructive pulmonary disease, unspecified: Secondary | ICD-10-CM | POA: Diagnosis not present

## 2019-06-06 DIAGNOSIS — L989 Disorder of the skin and subcutaneous tissue, unspecified: Secondary | ICD-10-CM | POA: Diagnosis not present

## 2019-06-06 DIAGNOSIS — K219 Gastro-esophageal reflux disease without esophagitis: Secondary | ICD-10-CM | POA: Diagnosis not present

## 2019-06-06 DIAGNOSIS — I10 Essential (primary) hypertension: Secondary | ICD-10-CM | POA: Diagnosis not present

## 2019-06-11 DIAGNOSIS — E7800 Pure hypercholesterolemia, unspecified: Secondary | ICD-10-CM | POA: Diagnosis not present

## 2019-06-11 DIAGNOSIS — Z780 Asymptomatic menopausal state: Secondary | ICD-10-CM | POA: Diagnosis not present

## 2019-06-11 DIAGNOSIS — I10 Essential (primary) hypertension: Secondary | ICD-10-CM | POA: Diagnosis not present

## 2019-06-11 DIAGNOSIS — I2510 Atherosclerotic heart disease of native coronary artery without angina pectoris: Secondary | ICD-10-CM | POA: Diagnosis not present

## 2019-06-11 DIAGNOSIS — L989 Disorder of the skin and subcutaneous tissue, unspecified: Secondary | ICD-10-CM | POA: Diagnosis not present

## 2019-06-11 DIAGNOSIS — I2584 Coronary atherosclerosis due to calcified coronary lesion: Secondary | ICD-10-CM | POA: Diagnosis not present

## 2019-06-11 DIAGNOSIS — I700 Atherosclerosis of aorta: Secondary | ICD-10-CM | POA: Diagnosis not present

## 2019-06-14 ENCOUNTER — Other Ambulatory Visit: Payer: Self-pay | Admitting: Internal Medicine

## 2019-06-14 DIAGNOSIS — R5381 Other malaise: Secondary | ICD-10-CM

## 2019-06-21 ENCOUNTER — Other Ambulatory Visit: Payer: Self-pay | Admitting: Internal Medicine

## 2019-06-21 DIAGNOSIS — L820 Inflamed seborrheic keratosis: Secondary | ICD-10-CM | POA: Diagnosis not present

## 2019-06-21 DIAGNOSIS — D485 Neoplasm of uncertain behavior of skin: Secondary | ICD-10-CM | POA: Diagnosis not present

## 2019-06-21 DIAGNOSIS — L821 Other seborrheic keratosis: Secondary | ICD-10-CM | POA: Diagnosis not present

## 2019-06-21 DIAGNOSIS — E2839 Other primary ovarian failure: Secondary | ICD-10-CM

## 2019-06-21 DIAGNOSIS — L218 Other seborrheic dermatitis: Secondary | ICD-10-CM | POA: Diagnosis not present

## 2019-06-28 DIAGNOSIS — R69 Illness, unspecified: Secondary | ICD-10-CM | POA: Diagnosis not present

## 2019-07-02 ENCOUNTER — Ambulatory Visit
Admission: RE | Admit: 2019-07-02 | Discharge: 2019-07-02 | Disposition: A | Discharge status: Home / Self Care | Payer: Medicare HMO | Source: Ambulatory Visit | Attending: Internal Medicine | Admitting: Internal Medicine

## 2019-07-02 ENCOUNTER — Other Ambulatory Visit: Payer: Self-pay

## 2019-07-02 DIAGNOSIS — E2839 Other primary ovarian failure: Secondary | ICD-10-CM

## 2019-07-02 DIAGNOSIS — M8589 Other specified disorders of bone density and structure, multiple sites: Secondary | ICD-10-CM | POA: Diagnosis not present

## 2019-07-02 DIAGNOSIS — Z780 Asymptomatic menopausal state: Secondary | ICD-10-CM | POA: Diagnosis not present

## 2019-09-25 DIAGNOSIS — E7800 Pure hypercholesterolemia, unspecified: Secondary | ICD-10-CM | POA: Diagnosis not present

## 2019-09-25 DIAGNOSIS — Z5181 Encounter for therapeutic drug level monitoring: Secondary | ICD-10-CM | POA: Diagnosis not present

## 2019-11-22 DIAGNOSIS — F3341 Major depressive disorder, recurrent, in partial remission: Secondary | ICD-10-CM | POA: Diagnosis not present

## 2019-11-22 DIAGNOSIS — I10 Essential (primary) hypertension: Secondary | ICD-10-CM | POA: Diagnosis not present

## 2019-11-22 DIAGNOSIS — F325 Major depressive disorder, single episode, in full remission: Secondary | ICD-10-CM | POA: Diagnosis not present

## 2019-11-22 DIAGNOSIS — E7800 Pure hypercholesterolemia, unspecified: Secondary | ICD-10-CM | POA: Diagnosis not present

## 2019-11-22 DIAGNOSIS — G459 Transient cerebral ischemic attack, unspecified: Secondary | ICD-10-CM | POA: Diagnosis not present

## 2019-11-22 DIAGNOSIS — I2510 Atherosclerotic heart disease of native coronary artery without angina pectoris: Secondary | ICD-10-CM | POA: Diagnosis not present

## 2019-11-22 DIAGNOSIS — J441 Chronic obstructive pulmonary disease with (acute) exacerbation: Secondary | ICD-10-CM | POA: Diagnosis not present

## 2019-11-22 DIAGNOSIS — J449 Chronic obstructive pulmonary disease, unspecified: Secondary | ICD-10-CM | POA: Diagnosis not present

## 2019-11-22 DIAGNOSIS — I2584 Coronary atherosclerosis due to calcified coronary lesion: Secondary | ICD-10-CM | POA: Diagnosis not present

## 2020-02-20 DIAGNOSIS — Z1389 Encounter for screening for other disorder: Secondary | ICD-10-CM | POA: Diagnosis not present

## 2020-02-20 DIAGNOSIS — G4733 Obstructive sleep apnea (adult) (pediatric): Secondary | ICD-10-CM | POA: Diagnosis not present

## 2020-02-20 DIAGNOSIS — F1021 Alcohol dependence, in remission: Secondary | ICD-10-CM | POA: Diagnosis not present

## 2020-02-20 DIAGNOSIS — F325 Major depressive disorder, single episode, in full remission: Secondary | ICD-10-CM | POA: Diagnosis not present

## 2020-02-20 DIAGNOSIS — K219 Gastro-esophageal reflux disease without esophagitis: Secondary | ICD-10-CM | POA: Diagnosis not present

## 2020-02-20 DIAGNOSIS — I2584 Coronary atherosclerosis due to calcified coronary lesion: Secondary | ICD-10-CM | POA: Diagnosis not present

## 2020-02-20 DIAGNOSIS — Z6839 Body mass index (BMI) 39.0-39.9, adult: Secondary | ICD-10-CM | POA: Diagnosis not present

## 2020-02-20 DIAGNOSIS — I10 Essential (primary) hypertension: Secondary | ICD-10-CM | POA: Diagnosis not present

## 2020-02-20 DIAGNOSIS — J449 Chronic obstructive pulmonary disease, unspecified: Secondary | ICD-10-CM | POA: Diagnosis not present

## 2020-02-20 DIAGNOSIS — E7800 Pure hypercholesterolemia, unspecified: Secondary | ICD-10-CM | POA: Diagnosis not present

## 2020-02-20 DIAGNOSIS — Z0000 Encounter for general adult medical examination without abnormal findings: Secondary | ICD-10-CM | POA: Diagnosis not present

## 2020-04-22 DIAGNOSIS — G459 Transient cerebral ischemic attack, unspecified: Secondary | ICD-10-CM | POA: Diagnosis not present

## 2020-04-22 DIAGNOSIS — J449 Chronic obstructive pulmonary disease, unspecified: Secondary | ICD-10-CM | POA: Diagnosis not present

## 2020-04-22 DIAGNOSIS — I10 Essential (primary) hypertension: Secondary | ICD-10-CM | POA: Diagnosis not present

## 2020-04-22 DIAGNOSIS — E7800 Pure hypercholesterolemia, unspecified: Secondary | ICD-10-CM | POA: Diagnosis not present

## 2020-04-22 DIAGNOSIS — I2510 Atherosclerotic heart disease of native coronary artery without angina pectoris: Secondary | ICD-10-CM | POA: Diagnosis not present

## 2020-04-22 DIAGNOSIS — M1610 Unilateral primary osteoarthritis, unspecified hip: Secondary | ICD-10-CM | POA: Diagnosis not present

## 2020-04-22 DIAGNOSIS — J441 Chronic obstructive pulmonary disease with (acute) exacerbation: Secondary | ICD-10-CM | POA: Diagnosis not present

## 2020-04-22 DIAGNOSIS — F325 Major depressive disorder, single episode, in full remission: Secondary | ICD-10-CM | POA: Diagnosis not present

## 2020-04-22 DIAGNOSIS — F3341 Major depressive disorder, recurrent, in partial remission: Secondary | ICD-10-CM | POA: Diagnosis not present

## 2020-04-23 ENCOUNTER — Other Ambulatory Visit: Payer: Self-pay | Admitting: Internal Medicine

## 2020-04-23 DIAGNOSIS — Z1231 Encounter for screening mammogram for malignant neoplasm of breast: Secondary | ICD-10-CM

## 2020-06-02 ENCOUNTER — Ambulatory Visit: Payer: Medicare HMO

## 2020-06-15 DIAGNOSIS — E7800 Pure hypercholesterolemia, unspecified: Secondary | ICD-10-CM | POA: Diagnosis not present

## 2020-06-15 DIAGNOSIS — I2510 Atherosclerotic heart disease of native coronary artery without angina pectoris: Secondary | ICD-10-CM | POA: Diagnosis not present

## 2020-06-15 DIAGNOSIS — J441 Chronic obstructive pulmonary disease with (acute) exacerbation: Secondary | ICD-10-CM | POA: Diagnosis not present

## 2020-06-15 DIAGNOSIS — M1610 Unilateral primary osteoarthritis, unspecified hip: Secondary | ICD-10-CM | POA: Diagnosis not present

## 2020-06-15 DIAGNOSIS — F3341 Major depressive disorder, recurrent, in partial remission: Secondary | ICD-10-CM | POA: Diagnosis not present

## 2020-06-15 DIAGNOSIS — J449 Chronic obstructive pulmonary disease, unspecified: Secondary | ICD-10-CM | POA: Diagnosis not present

## 2020-06-15 DIAGNOSIS — F325 Major depressive disorder, single episode, in full remission: Secondary | ICD-10-CM | POA: Diagnosis not present

## 2020-06-15 DIAGNOSIS — G459 Transient cerebral ischemic attack, unspecified: Secondary | ICD-10-CM | POA: Diagnosis not present

## 2020-06-15 DIAGNOSIS — I10 Essential (primary) hypertension: Secondary | ICD-10-CM | POA: Diagnosis not present

## 2020-07-09 DIAGNOSIS — M1610 Unilateral primary osteoarthritis, unspecified hip: Secondary | ICD-10-CM | POA: Diagnosis not present

## 2020-07-09 DIAGNOSIS — I2510 Atherosclerotic heart disease of native coronary artery without angina pectoris: Secondary | ICD-10-CM | POA: Diagnosis not present

## 2020-07-09 DIAGNOSIS — E7800 Pure hypercholesterolemia, unspecified: Secondary | ICD-10-CM | POA: Diagnosis not present

## 2020-07-09 DIAGNOSIS — I10 Essential (primary) hypertension: Secondary | ICD-10-CM | POA: Diagnosis not present

## 2020-07-09 DIAGNOSIS — J441 Chronic obstructive pulmonary disease with (acute) exacerbation: Secondary | ICD-10-CM | POA: Diagnosis not present

## 2020-07-09 DIAGNOSIS — G459 Transient cerebral ischemic attack, unspecified: Secondary | ICD-10-CM | POA: Diagnosis not present

## 2020-07-09 DIAGNOSIS — F3341 Major depressive disorder, recurrent, in partial remission: Secondary | ICD-10-CM | POA: Diagnosis not present

## 2020-07-09 DIAGNOSIS — F325 Major depressive disorder, single episode, in full remission: Secondary | ICD-10-CM | POA: Diagnosis not present

## 2020-07-09 DIAGNOSIS — J449 Chronic obstructive pulmonary disease, unspecified: Secondary | ICD-10-CM | POA: Diagnosis not present

## 2020-09-01 DIAGNOSIS — R059 Cough, unspecified: Secondary | ICD-10-CM | POA: Diagnosis not present

## 2020-09-01 DIAGNOSIS — Z03818 Encounter for observation for suspected exposure to other biological agents ruled out: Secondary | ICD-10-CM | POA: Diagnosis not present

## 2020-09-03 DIAGNOSIS — R0781 Pleurodynia: Secondary | ICD-10-CM | POA: Diagnosis not present

## 2020-09-03 DIAGNOSIS — M25572 Pain in left ankle and joints of left foot: Secondary | ICD-10-CM | POA: Diagnosis not present

## 2020-09-04 DIAGNOSIS — J441 Chronic obstructive pulmonary disease with (acute) exacerbation: Secondary | ICD-10-CM | POA: Diagnosis not present

## 2020-09-07 DIAGNOSIS — M25572 Pain in left ankle and joints of left foot: Secondary | ICD-10-CM | POA: Diagnosis not present

## 2020-09-08 ENCOUNTER — Other Ambulatory Visit (HOSPITAL_COMMUNITY)
Admission: RE | Admit: 2020-09-08 | Discharge: 2020-09-08 | Disposition: A | Discharge status: Home / Self Care | Payer: Medicare HMO | Source: Ambulatory Visit | Attending: Orthopedic Surgery | Admitting: Orthopedic Surgery

## 2020-09-08 ENCOUNTER — Encounter (HOSPITAL_COMMUNITY): Payer: Self-pay

## 2020-09-08 ENCOUNTER — Encounter (HOSPITAL_COMMUNITY)
Admission: RE | Admit: 2020-09-08 | Discharge: 2020-09-08 | Disposition: A | Discharge status: Home / Self Care | Payer: Medicare HMO | Source: Ambulatory Visit | Attending: Orthopedic Surgery | Admitting: Orthopedic Surgery

## 2020-09-08 ENCOUNTER — Ambulatory Visit: Payer: Self-pay | Admitting: Physician Assistant

## 2020-09-08 ENCOUNTER — Other Ambulatory Visit: Payer: Self-pay

## 2020-09-08 DIAGNOSIS — I493 Ventricular premature depolarization: Secondary | ICD-10-CM | POA: Insufficient documentation

## 2020-09-08 DIAGNOSIS — Z20822 Contact with and (suspected) exposure to covid-19: Secondary | ICD-10-CM | POA: Diagnosis not present

## 2020-09-08 DIAGNOSIS — Z7982 Long term (current) use of aspirin: Secondary | ICD-10-CM | POA: Diagnosis not present

## 2020-09-08 DIAGNOSIS — J449 Chronic obstructive pulmonary disease, unspecified: Secondary | ICD-10-CM | POA: Insufficient documentation

## 2020-09-08 DIAGNOSIS — X58XXXA Exposure to other specified factors, initial encounter: Secondary | ICD-10-CM | POA: Diagnosis not present

## 2020-09-08 DIAGNOSIS — Z79899 Other long term (current) drug therapy: Secondary | ICD-10-CM | POA: Insufficient documentation

## 2020-09-08 DIAGNOSIS — S82892A Other fracture of left lower leg, initial encounter for closed fracture: Secondary | ICD-10-CM | POA: Insufficient documentation

## 2020-09-08 DIAGNOSIS — Z01818 Encounter for other preprocedural examination: Secondary | ICD-10-CM | POA: Insufficient documentation

## 2020-09-08 DIAGNOSIS — I10 Essential (primary) hypertension: Secondary | ICD-10-CM | POA: Insufficient documentation

## 2020-09-08 DIAGNOSIS — G4733 Obstructive sleep apnea (adult) (pediatric): Secondary | ICD-10-CM | POA: Diagnosis not present

## 2020-09-08 HISTORY — DX: Other specified postprocedural states: Z98.890

## 2020-09-08 HISTORY — DX: Anxiety disorder, unspecified: F41.9

## 2020-09-08 HISTORY — DX: Family history of other specified conditions: Z84.89

## 2020-09-08 HISTORY — DX: Unspecified osteoarthritis, unspecified site: M19.90

## 2020-09-08 HISTORY — DX: Gastro-esophageal reflux disease without esophagitis: K21.9

## 2020-09-08 HISTORY — DX: Dyspnea, unspecified: R06.00

## 2020-09-08 HISTORY — DX: Pneumonia, unspecified organism: J18.9

## 2020-09-08 LAB — SARS CORONAVIRUS 2 (TAT 6-24 HRS): SARS Coronavirus 2: NEGATIVE

## 2020-09-08 NOTE — Progress Notes (Addendum)
Anesthesia Review:  PCP: DR Lavone Orn  Requested most recent office visit note of 09/01/2020 along with most recent ekg of 2015 , bmp from 01/2020 and covid test done 09/01/2020 .  Office aware surgery on 09/09/2020.  They are to fax asapl.   LOV- 09/01/2020  Cardiologist :none  Pulm-none per pt  Chest x-ray : EKG : Echo : Stress test: Cardiac Cath :  Activity level: can do a flight of stairs slowly per pt due to back issues  Sleep Study/ CPAP : not used cpap in 1-2 years Oxygen at nite - per pt report has not used in 1-2 years  Fasting Blood Sugar :      / Checks Blood Sugar -- times a day:   Blood Thinner/ Instructions /Last Dose: ASA / Instructions/ Last Dose :  81 mg Aspirin- pt reports last dose as being week of 08/31/2020

## 2020-09-08 NOTE — H&P (View-Only) (Signed)
Christina Walsh is an 64 y.o. female.   Chief Complaint: left ankle pain HPI: pt seen in [facility] 2 days following fall at home xrays revealing left bimalleolar ankle fracture she was placed into splint nwb and seen in followup for definitive plan.  Past Medical History:  Diagnosis Date  . Achilles tendinitis   . Alcoholism ([place])   . Anxiety   . Arthritis   . COPD (chronic obstructive pulmonary disease) (HCC)    not used oxygen  at nite x 1-2 years   . Depression   . Dyspnea    with a lot of exertion   . Family history of adverse reaction to anesthesia    sister- -N/V   . GERD (gastroesophageal reflux disease)   . History of carpal tunnel surgery   . Hyperlipidemia   . Hypertension   . OSA (obstructive sleep apnea)    mild  supposed to use cpap  not used in 1-2 years   . Pneumonia    as a small child   . PVC (premature ventricular contraction)   . TIA (transient ischemic attack)     Past Surgical History:  Procedure Laterality Date  . BREAST BIOPSY    . BREAST EXCISIONAL BIOPSY Bilateral 12/13/1996   benign  . CERVICAL SPINE SURGERY    . CESAREAN SECTION    . TONSILLECTOMY      Family History  Problem Relation Age of Onset  . Alcohol abuse Mother   . Hypertension Father   . Heart attack Father   . Heart disease Father    Social History:  reports that she quit smoking about 2 years ago. She has never used smokeless tobacco. She reports previous alcohol use. She reports that she does not use drugs.  Allergies:  Allergies  Allergen Reactions  . Hydrocodone Itching and Rash  . Augmentin [Amoxicillin-Pot Clavulanate] Other (See Comments)    C.Diff colitis  . Celexa [Citalopram Hydrobromide] Itching  . Ciprofloxacin Itching  . Codeine Itching and Swelling  . Coppertone Sport Spray Spf30 [Sunscreens] Itching  . Felodipine Other (See Comments)    Unknown  . Norvasc [Amlodipine Besylate] Swelling    Ankle swelling  . Zoloft [Sertraline Hcl] Itching and Rash     (Not in a hospital admission)   No results found for this or any previous visit (from the past 48 hour(s)). No results found.  Review of Systems  Respiratory: Positive for shortness of breath.   Cardiovascular: Positive for leg swelling.  Musculoskeletal: Positive for joint swelling.  All other systems reviewed and are negative.   There were no vitals taken for this visit. Physical Exam Constitutional:      General: She is not in acute distress.    Appearance: Normal appearance.  HENT:     Head: Normocephalic and atraumatic.  Eyes:     Extraocular Movements: Extraocular movements intact.     Pupils: Pupils are equal, round, and reactive to light.  Cardiovascular:     Rate and Rhythm: Normal rate.     Pulses: Normal pulses.  Pulmonary:     Effort: Pulmonary effort is normal. No respiratory distress.  Abdominal:     General: Abdomen is flat. There is no distension.  Musculoskeletal:        General: Swelling, tenderness and signs of injury present.     Cervical back: Normal range of motion.     Left ankle: Swelling present. Tenderness present. Decreased range of motion.  Skin:  General: Skin is warm and dry.     Findings: No erythema or rash.  Neurological:     General: No focal deficit present.     Mental Status: She is alert.  Psychiatric:        Mood and Affect: Mood normal.        Behavior: Behavior normal.      Assessment/Plan Left bimalleolar ankle fracture  Discussed risks and benefits of ORIF Left ankle fracture and patient wishes to proceed will set up as soon as possible outpt basis.    Chriss Czar, PA-C 09/08/2020, 1:49 PM

## 2020-09-08 NOTE — Progress Notes (Signed)
Anesthesia Chart Review   Case: [id number] Date/Time: 09/09/20 1115   Procedure: OPEN REDUCTION INTERNAL FIXATION (ORIF) ANKLE FRACTURE (Left Ankle)   Anesthesia type: Choice   Pre-op diagnosis: LEFT ANKLE FRACTURE   Location: [facility] ROOM 05 / [facility]   Surgeons: Earlie Server, MD      DISCUSSION:64 y.o. former smoker (10/24/17) with h/o HTN, PVC, COPD, OSA, left ankle fracture scheduled for above procedure 09/09/2020 with Dr. Earlie Server.   Pt recently seen by PCP for COPD exacerbation.  Last seen 09/04/2020.  Per OV note, "COPD exacerbation. Mild, improving without antibiotics or steroids today.  [place] for surgery next week per Dr. Laurann Montana."  Pt is same day workup.  Anticipate pt can proceed with planned procedure barring acute status change and after evaluation DOS.   Will need EKG and labs DOS.  VS: There were no vitals taken for this visit.  PROVIDERS: Lavone Orn, MD is PCP    LABS: labs DOS (all labs ordered are listed, but only abnormal results are displayed)  Labs Reviewed - No data to display   IMAGES:   EKG:   CV:  Past Medical History:  Diagnosis Date  . Achilles tendinitis   . Alcoholism ([place])   . Anxiety   . Arthritis   . COPD (chronic obstructive pulmonary disease) (HCC)    not used oxygen  at nite x 1-2 years   . Depression   . Dyspnea    with a lot of exertion   . Family history of adverse reaction to anesthesia    sister- -N/V   . GERD (gastroesophageal reflux disease)   . History of carpal tunnel surgery   . Hyperlipidemia   . Hypertension   . OSA (obstructive sleep apnea)    mild  supposed to use cpap  not used in 1-2 years   . Pneumonia    as a small child   . PVC (premature ventricular contraction)   . TIA (transient ischemic attack)     Past Surgical History:  Procedure Laterality Date  . BREAST BIOPSY    . BREAST EXCISIONAL BIOPSY Bilateral 12/13/1996   benign  . CERVICAL SPINE SURGERY    . CESAREAN SECTION    . TONSILLECTOMY       MEDICATIONS: . acetaminophen (TYLENOL) 325 MG tablet  . albuterol (VENTOLIN HFA) 108 (90 Base) MCG/ACT inhaler  . ascorbic acid (VITAMIN C) 500 MG tablet  . aspirin EC 81 MG tablet  . atorvastatin (LIPITOR) 10 MG tablet  . buPROPion (WELLBUTRIN XL) 300 MG 24 hr tablet  . cetirizine (ZYRTEC) 10 MG tablet  . cloNIDine (CATAPRES) 0.1 MG tablet  . diazepam (VALIUM) 5 MG tablet  . diltiazem (DILACOR XR) 240 MG 24 hr capsule  . famotidine (PEPCID) 20 MG tablet  . hydrOXYzine (ATARAX/VISTARIL) 10 MG tablet  . losartan-hydrochlorothiazide (HYZAAR) 100-12.5 MG tablet  . naproxen sodium (ALEVE) 220 MG tablet  . omeprazole (PRILOSEC) 20 MG capsule  . oxycodone (OXY-IR) 5 MG capsule  . PARoxetine (PAXIL) 40 MG tablet  . STIOLTO RESPIMAT 2.5-2.5 MCG/ACT AERS   No current facility-administered medications for this encounter.    Konrad Felix, PA-C [facility] [phone]

## 2020-09-08 NOTE — Anesthesia Preprocedure Evaluation (Addendum)
Anesthesia Evaluation  Patient identified by MRN, date of birth, ID band Patient awake    Reviewed: Allergy & Precautions, NPO status , Patient's Chart, lab work & pertinent test results  Airway Mallampati: II  TM Distance: >3 FB Neck ROM: Full    Dental no notable dental hx. (+) Teeth Intact, Dental Advisory Given   Pulmonary shortness of breath, sleep apnea and Continuous Positive Airway Pressure Ventilation , COPD,  COPD inhaler, former smoker,    Pulmonary exam normal breath sounds clear to auscultation       Cardiovascular hypertension, Pt. on medications Normal cardiovascular exam Rhythm:Regular Rate:Normal     Neuro/Psych PSYCHIATRIC DISORDERS Anxiety Depression TIA   GI/Hepatic Neg liver ROS, GERD  Medicated and Controlled,  Endo/Other  negative endocrine ROS  Renal/GU      Musculoskeletal  (+) Arthritis ,   Abdominal   Peds  Hematology   Anesthesia Other Findings   Reproductive/Obstetrics                            Anesthesia Physical Anesthesia Plan  ASA: III  Anesthesia Plan: Regional and MAC   Post-op Pain Management:    Induction:   PONV Risk Score and Plan: 3 and Treatment may vary due to age or medical condition, Ondansetron and Midazolam  Airway Management Planned: Nasal Cannula and Natural Airway  Additional Equipment: None  Intra-op Plan:   Post-operative Plan:   Informed Consent: I have reviewed the patients History and Physical, chart, labs and discussed the procedure including the risks, benefits and alternatives for the proposed anesthesia with the patient or authorized representative who has indicated his/her understanding and acceptance.     Dental advisory given  Plan Discussed with: CRNA and Anesthesiologist  Anesthesia Plan Comments: (Mac w L popliteal & L Adductor canla blocks)       Anesthesia Quick Evaluation

## 2020-09-08 NOTE — H&P (Signed)
Christina Walsh is an 64 y.o. female.   Chief Complaint: left ankle pain HPI: pt seen in [facility] 2 days following fall at home xrays revealing left bimalleolar ankle fracture she was placed into splint nwb and seen in followup for definitive plan.  Past Medical History:  Diagnosis Date  . Achilles tendinitis   . Alcoholism ([place])   . Anxiety   . Arthritis   . COPD (chronic obstructive pulmonary disease) (HCC)    not used oxygen  at nite x 1-2 years   . Depression   . Dyspnea    with a lot of exertion   . Family history of adverse reaction to anesthesia    sister- -N/V   . GERD (gastroesophageal reflux disease)   . History of carpal tunnel surgery   . Hyperlipidemia   . Hypertension   . OSA (obstructive sleep apnea)    mild  supposed to use cpap  not used in 1-2 years   . Pneumonia    as a small child   . PVC (premature ventricular contraction)   . TIA (transient ischemic attack)     Past Surgical History:  Procedure Laterality Date  . BREAST BIOPSY    . BREAST EXCISIONAL BIOPSY Bilateral 12/13/1996   benign  . CERVICAL SPINE SURGERY    . CESAREAN SECTION    . TONSILLECTOMY      Family History  Problem Relation Age of Onset  . Alcohol abuse Mother   . Hypertension Father   . Heart attack Father   . Heart disease Father    Social History:  reports that she quit smoking about 2 years ago. She has never used smokeless tobacco. She reports previous alcohol use. She reports that she does not use drugs.  Allergies:  Allergies  Allergen Reactions  . Hydrocodone Itching and Rash  . Augmentin [Amoxicillin-Pot Clavulanate] Other (See Comments)    C.Diff colitis  . Celexa [Citalopram Hydrobromide] Itching  . Ciprofloxacin Itching  . Codeine Itching and Swelling  . Coppertone Sport Spray Spf30 [Sunscreens] Itching  . Felodipine Other (See Comments)    Unknown  . Norvasc [Amlodipine Besylate] Swelling    Ankle swelling  . Zoloft [Sertraline Hcl] Itching and Rash     (Not in a hospital admission)   No results found for this or any previous visit (from the past 48 hour(s)). No results found.  Review of Systems  Respiratory: Positive for shortness of breath.   Cardiovascular: Positive for leg swelling.  Musculoskeletal: Positive for joint swelling.  All other systems reviewed and are negative.   There were no vitals taken for this visit. Physical Exam Constitutional:      General: She is not in acute distress.    Appearance: Normal appearance.  HENT:     Head: Normocephalic and atraumatic.  Eyes:     Extraocular Movements: Extraocular movements intact.     Pupils: Pupils are equal, round, and reactive to light.  Cardiovascular:     Rate and Rhythm: Normal rate.     Pulses: Normal pulses.  Pulmonary:     Effort: Pulmonary effort is normal. No respiratory distress.  Abdominal:     General: Abdomen is flat. There is no distension.  Musculoskeletal:        General: Swelling, tenderness and signs of injury present.     Cervical back: Normal range of motion.     Left ankle: Swelling present. Tenderness present. Decreased range of motion.  Skin:  General: Skin is warm and dry.     Findings: No erythema or rash.  Neurological:     General: No focal deficit present.     Mental Status: She is alert.  Psychiatric:        Mood and Affect: Mood normal.        Behavior: Behavior normal.      Assessment/Plan Left bimalleolar ankle fracture  Discussed risks and benefits of ORIF Left ankle fracture and patient wishes to proceed will set up as soon as possible outpt basis.    Chriss Czar, PA-C 09/08/2020, 1:49 PM

## 2020-09-09 ENCOUNTER — Ambulatory Visit (HOSPITAL_COMMUNITY): Payer: Medicare HMO

## 2020-09-09 ENCOUNTER — Encounter (HOSPITAL_COMMUNITY): Payer: Self-pay | Admitting: Orthopedic Surgery

## 2020-09-09 ENCOUNTER — Ambulatory Visit (HOSPITAL_COMMUNITY): Payer: Medicare HMO | Admitting: Certified Registered"

## 2020-09-09 ENCOUNTER — Ambulatory Visit (HOSPITAL_COMMUNITY)
Admission: RE | Admit: 2020-09-09 | Discharge: 2020-09-09 | Disposition: A | Discharge status: Home / Self Care | Payer: Medicare HMO | Source: Ambulatory Visit | Attending: Orthopedic Surgery | Admitting: Orthopedic Surgery

## 2020-09-09 ENCOUNTER — Encounter (HOSPITAL_COMMUNITY)
Admission: RE | Disposition: A | Discharge status: Home / Self Care | Payer: Self-pay | Source: Ambulatory Visit | Attending: Orthopedic Surgery

## 2020-09-09 DIAGNOSIS — Z881 Allergy status to other antibiotic agents status: Secondary | ICD-10-CM | POA: Diagnosis not present

## 2020-09-09 DIAGNOSIS — Z885 Allergy status to narcotic agent status: Secondary | ICD-10-CM | POA: Diagnosis not present

## 2020-09-09 DIAGNOSIS — S82842A Displaced bimalleolar fracture of left lower leg, initial encounter for closed fracture: Secondary | ICD-10-CM | POA: Insufficient documentation

## 2020-09-09 DIAGNOSIS — I10 Essential (primary) hypertension: Secondary | ICD-10-CM | POA: Diagnosis not present

## 2020-09-09 DIAGNOSIS — Z87891 Personal history of nicotine dependence: Secondary | ICD-10-CM | POA: Diagnosis not present

## 2020-09-09 DIAGNOSIS — Z880 Allergy status to penicillin: Secondary | ICD-10-CM | POA: Diagnosis not present

## 2020-09-09 DIAGNOSIS — X58XXXA Exposure to other specified factors, initial encounter: Secondary | ICD-10-CM | POA: Diagnosis not present

## 2020-09-09 DIAGNOSIS — Z8673 Personal history of transient ischemic attack (TIA), and cerebral infarction without residual deficits: Secondary | ICD-10-CM | POA: Diagnosis not present

## 2020-09-09 DIAGNOSIS — G8918 Other acute postprocedural pain: Secondary | ICD-10-CM | POA: Diagnosis not present

## 2020-09-09 DIAGNOSIS — Z419 Encounter for procedure for purposes other than remedying health state, unspecified: Secondary | ICD-10-CM

## 2020-09-09 DIAGNOSIS — Z0389 Encounter for observation for other suspected diseases and conditions ruled out: Secondary | ICD-10-CM | POA: Diagnosis not present

## 2020-09-09 DIAGNOSIS — M4802 Spinal stenosis, cervical region: Secondary | ICD-10-CM | POA: Diagnosis not present

## 2020-09-09 HISTORY — PX: ORIF ANKLE FRACTURE: SHX5408

## 2020-09-09 LAB — TYPE AND SCREEN
ABO/RH(D): A POS
Antibody Screen: NEGATIVE

## 2020-09-09 LAB — COMPREHENSIVE METABOLIC PANEL WITH GFR
ALT: 35 U/L (ref 0–44)
AST: 33 U/L (ref 15–41)
Albumin: 4.2 g/dL (ref 3.5–5.0)
Alkaline Phosphatase: 81 U/L (ref 38–126)
Anion gap: 10 (ref 5–15)
BUN: 15 mg/dL (ref 8–23)
CO2: 27 mmol/L (ref 22–32)
Calcium: 9.3 mg/dL (ref 8.9–10.3)
Chloride: 98 mmol/L (ref 98–111)
Creatinine, Ser: 0.69 mg/dL (ref 0.44–1.00)
GFR, Estimated: >60 mL/min
Glucose, Bld: 98 mg/dL (ref 70–99)
Potassium: 3.8 mmol/L (ref 3.5–5.1)
Sodium: 135 mmol/L (ref 135–145)
Total Bilirubin: 0.6 mg/dL (ref 0.3–1.2)
Total Protein: 7 g/dL (ref 6.5–8.1)

## 2020-09-09 LAB — CBC WITH DIFFERENTIAL/PLATELET
Abs Immature Granulocytes: 0.02 10*3/uL (ref 0.00–0.07)
Basophils Absolute: 0 10*3/uL (ref 0.0–0.1)
Basophils Relative: 1 %
Eosinophils Absolute: 0.3 10*3/uL (ref 0.0–0.5)
Eosinophils Relative: 5 %
HCT: 38 % (ref 36.0–46.0)
Hemoglobin: 12.4 g/dL (ref 12.0–15.0)
Immature Granulocytes: 0 %
Lymphocytes Relative: 21 %
Lymphs Abs: 1.3 10*3/uL (ref 0.7–4.0)
MCH: 29.9 pg (ref 26.0–34.0)
MCHC: 32.6 g/dL (ref 30.0–36.0)
MCV: 91.6 fL (ref 80.0–100.0)
Monocytes Absolute: 0.6 10*3/uL (ref 0.1–1.0)
Monocytes Relative: 9 %
Neutro Abs: 3.9 10*3/uL (ref 1.7–7.7)
Neutrophils Relative %: 64 %
Platelets: 289 10*3/uL (ref 150–400)
RBC: 4.15 MIL/uL (ref 3.87–5.11)
RDW: 13.6 % (ref 11.5–15.5)
WBC: 6.2 10*3/uL (ref 4.0–10.5)
nRBC: 0 % (ref 0.0–0.2)

## 2020-09-09 LAB — ABO/RH: ABO/RH(D): A POS

## 2020-09-09 SURGERY — OPEN REDUCTION INTERNAL FIXATION (ORIF) ANKLE FRACTURE
Anesthesia: Monitor Anesthesia Care | Site: Ankle | Laterality: Left

## 2020-09-09 MED ORDER — PHENYLEPHRINE 40 MCG/ML (10ML) SYRINGE FOR IV PUSH (FOR BLOOD PRESSURE SUPPORT)
PREFILLED_SYRINGE | INTRAVENOUS | Status: AC
  Filled 2020-09-09: qty 10

## 2020-09-09 MED ORDER — METOCLOPRAMIDE HCL 5 MG PO TABS: 5.0000 mg | ORAL_TABLET | Freq: Three times a day (TID) | ORAL | Status: DC | PRN

## 2020-09-09 MED ORDER — FENTANYL CITRATE (PF) 100 MCG/2ML IJ SOLN
50.0000 ug | Freq: Once | INTRAMUSCULAR | Status: AC
  Administered 2020-09-09: 50 ug via INTRAVENOUS
  Filled 2020-09-09: qty 2

## 2020-09-09 MED ORDER — ONDANSETRON HCL 4 MG/2ML IJ SOLN
INTRAMUSCULAR | Status: AC
  Filled 2020-09-09: qty 2

## 2020-09-09 MED ORDER — DEXAMETHASONE SODIUM PHOSPHATE 10 MG/ML IJ SOLN
INTRAMUSCULAR | Status: DC | PRN
  Administered 2020-09-09: 4 mg via INTRAVENOUS

## 2020-09-09 MED ORDER — ONDANSETRON HCL 4 MG/2ML IJ SOLN: 4.0000 mg | Freq: Once | INTRAMUSCULAR | Status: DC | PRN

## 2020-09-09 MED ORDER — PHENYLEPHRINE HCL (PRESSORS) 10 MG/ML IV SOLN
INTRAVENOUS | Status: AC
  Filled 2020-09-09: qty 1

## 2020-09-09 MED ORDER — PROPOFOL 1000 MG/100ML IV EMUL
INTRAVENOUS | Status: AC
  Filled 2020-09-09: qty 100

## 2020-09-09 MED ORDER — LACTATED RINGERS IV SOLN: INTRAVENOUS | Status: DC

## 2020-09-09 MED ORDER — ALBUTEROL SULFATE HFA 108 (90 BASE) MCG/ACT IN AERS: 2.0000 | INHALATION_SPRAY | Freq: Four times a day (QID) | RESPIRATORY_TRACT | Status: DC | PRN

## 2020-09-09 MED ORDER — FAMOTIDINE 20 MG PO TABS: 20.0000 mg | ORAL_TABLET | Freq: Two times a day (BID) | ORAL | Status: DC

## 2020-09-09 MED ORDER — DEXAMETHASONE SODIUM PHOSPHATE 10 MG/ML IJ SOLN
INTRAMUSCULAR | Status: AC
  Filled 2020-09-09: qty 1

## 2020-09-09 MED ORDER — PROPOFOL 500 MG/50ML IV EMUL
INTRAVENOUS | Status: DC | PRN
  Administered 2020-09-09: 125 ug/kg/min via INTRAVENOUS

## 2020-09-09 MED ORDER — ACETAMINOPHEN 325 MG PO TABS: 650.0000 mg | ORAL_TABLET | ORAL | Status: DC | PRN

## 2020-09-09 MED ORDER — SODIUM CHLORIDE 0.9 % IV SOLN: INTRAVENOUS | Status: DC

## 2020-09-09 MED ORDER — OXYCODONE HCL 5 MG PO TABS: 5.0000 mg | ORAL_TABLET | ORAL | Status: DC | PRN

## 2020-09-09 MED ORDER — LIDOCAINE 2% (20 MG/ML) 5 ML SYRINGE
INTRAMUSCULAR | Status: DC | PRN
  Administered 2020-09-09: 40 mg via INTRAVENOUS

## 2020-09-09 MED ORDER — FENTANYL CITRATE (PF) 100 MCG/2ML IJ SOLN: 25.0000 ug | INTRAMUSCULAR | Status: DC | PRN

## 2020-09-09 MED ORDER — DOCUSATE SODIUM 100 MG PO CAPS: 100.0000 mg | ORAL_CAPSULE | Freq: Two times a day (BID) | ORAL | Status: DC

## 2020-09-09 MED ORDER — MIDAZOLAM HCL 2 MG/2ML IJ SOLN
1.0000 mg | Freq: Once | INTRAMUSCULAR | Status: AC
  Administered 2020-09-09: 2 mg via INTRAVENOUS
  Filled 2020-09-09: qty 2

## 2020-09-09 MED ORDER — SUCCINYLCHOLINE CHLORIDE 200 MG/10ML IV SOSY
PREFILLED_SYRINGE | INTRAVENOUS | Status: AC
  Filled 2020-09-09: qty 10

## 2020-09-09 MED ORDER — ORAL CARE MOUTH RINSE: 15.0000 mL | Freq: Once | OROMUCOSAL | Status: AC

## 2020-09-09 MED ORDER — PROPOFOL 10 MG/ML IV BOLUS
INTRAVENOUS | Status: AC
  Filled 2020-09-09: qty 20

## 2020-09-09 MED ORDER — CLONIDINE HCL 0.1 MG PO TABS: 0.1000 mg | ORAL_TABLET | Freq: Two times a day (BID) | ORAL | Status: DC

## 2020-09-09 MED ORDER — MIDAZOLAM HCL 2 MG/2ML IJ SOLN
INTRAMUSCULAR | Status: DC | PRN
  Administered 2020-09-09: 2 mg via INTRAVENOUS

## 2020-09-09 MED ORDER — FENTANYL CITRATE (PF) 250 MCG/5ML IJ SOLN
INTRAMUSCULAR | Status: DC | PRN
  Administered 2020-09-09 (×2): 25 ug via INTRAVENOUS
  Administered 2020-09-09: 50 ug via INTRAVENOUS

## 2020-09-09 MED ORDER — 0.9 % SODIUM CHLORIDE (POUR BTL) OPTIME
TOPICAL | Status: DC | PRN
  Administered 2020-09-09: 1000 mL

## 2020-09-09 MED ORDER — VANCOMYCIN HCL IN DEXTROSE 1-5 GM/200ML-% IV SOLN
1000.0000 mg | INTRAVENOUS | Status: AC
  Administered 2020-09-09: 1000 mg via INTRAVENOUS
  Filled 2020-09-09: qty 200

## 2020-09-09 MED ORDER — CHLORHEXIDINE GLUCONATE 0.12 % MT SOLN
15.0000 mL | Freq: Once | OROMUCOSAL | Status: AC
  Administered 2020-09-09: 15 mL via OROMUCOSAL

## 2020-09-09 MED ORDER — BUPIVACAINE-EPINEPHRINE (PF) 0.5% -1:200000 IJ SOLN
INTRAMUSCULAR | Status: DC | PRN
  Administered 2020-09-09: 18 mL via PERINEURAL
  Administered 2020-09-09: 30 mL via PERINEURAL

## 2020-09-09 MED ORDER — LIDOCAINE 2% (20 MG/ML) 5 ML SYRINGE
INTRAMUSCULAR | Status: AC
  Filled 2020-09-09: qty 5

## 2020-09-09 MED ORDER — ONDANSETRON HCL 4 MG PO TABS: 4.0000 mg | ORAL_TABLET | Freq: Four times a day (QID) | ORAL | Status: DC | PRN

## 2020-09-09 MED ORDER — ONDANSETRON HCL 4 MG/2ML IJ SOLN: 4.0000 mg | Freq: Four times a day (QID) | INTRAMUSCULAR | Status: DC | PRN

## 2020-09-09 MED ORDER — ROCURONIUM BROMIDE 10 MG/ML (PF) SYRINGE
PREFILLED_SYRINGE | INTRAVENOUS | Status: AC
  Filled 2020-09-09: qty 10

## 2020-09-09 MED ORDER — DIAZEPAM 2 MG PO TABS: 5.0000 mg | ORAL_TABLET | Freq: Every day | ORAL | Status: DC | PRN

## 2020-09-09 MED ORDER — SUGAMMADEX SODIUM 200 MG/2ML IV SOLN
INTRAVENOUS | Status: DC | PRN
  Administered 2020-09-09 (×2): 200 mg via INTRAVENOUS

## 2020-09-09 MED ORDER — ACETAMINOPHEN 10 MG/ML IV SOLN: 1000.0000 mg | Freq: Once | INTRAVENOUS | Status: DC | PRN

## 2020-09-09 MED ORDER — PROPOFOL 10 MG/ML IV BOLUS
INTRAVENOUS | Status: DC | PRN
  Administered 2020-09-09: 100 mg via INTRAVENOUS
  Administered 2020-09-09: 200 mg via INTRAVENOUS
  Administered 2020-09-09: 50 mg via INTRAVENOUS

## 2020-09-09 MED ORDER — PHENYLEPHRINE 40 MCG/ML (10ML) SYRINGE FOR IV PUSH (FOR BLOOD PRESSURE SUPPORT)
PREFILLED_SYRINGE | INTRAVENOUS | Status: DC | PRN
  Administered 2020-09-09 (×2): 40 ug via INTRAVENOUS

## 2020-09-09 MED ORDER — HYDROMORPHONE HCL 1 MG/ML IJ SOLN: 0.5000 mg | INTRAMUSCULAR | Status: DC | PRN

## 2020-09-09 MED ORDER — ROCURONIUM BROMIDE 10 MG/ML (PF) SYRINGE
PREFILLED_SYRINGE | INTRAVENOUS | Status: DC | PRN
  Administered 2020-09-09: 10 mg via INTRAVENOUS
  Administered 2020-09-09: 30 mg via INTRAVENOUS

## 2020-09-09 MED ORDER — SUCCINYLCHOLINE CHLORIDE 200 MG/10ML IV SOSY
PREFILLED_SYRINGE | INTRAVENOUS | Status: DC | PRN
  Administered 2020-09-09: 140 mg via INTRAVENOUS

## 2020-09-09 MED ORDER — METOCLOPRAMIDE HCL 5 MG/ML IJ SOLN: 5.0000 mg | Freq: Three times a day (TID) | INTRAMUSCULAR | Status: DC | PRN

## 2020-09-09 MED ORDER — FENTANYL CITRATE (PF) 100 MCG/2ML IJ SOLN
INTRAMUSCULAR | Status: AC
  Filled 2020-09-09: qty 2

## 2020-09-09 SURGICAL SUPPLY — 48 items
BAG ZIPLOCK 12X15 (MISCELLANEOUS) ×3 IMPLANT
BENZOIN TINCTURE PRP APPL 2/3 (GAUZE/BANDAGES/DRESSINGS) ×3 IMPLANT
BIT DRILL 2 CANN GRADUATED (BIT) ×3 IMPLANT
BIT DRILL 2.5 CANN LNG (BIT) ×3 IMPLANT
BIT DRILL 2.5 CANN STRL (BIT) ×3 IMPLANT
BIT DRILL 2.6 CANN (BIT) ×3 IMPLANT
CLOSURE STERI-STRIP 1/2X4 (GAUZE/BANDAGES/DRESSINGS) ×1
CLOSURE WOUND 1/2 X4 (GAUZE/BANDAGES/DRESSINGS) ×1
CLSR STERI-STRIP ANTIMIC 1/2X4 (GAUZE/BANDAGES/DRESSINGS) ×2 IMPLANT
COVER SURGICAL LIGHT HANDLE (MISCELLANEOUS) ×3 IMPLANT
COVER WAND RF STERILE (DRAPES) IMPLANT
CUFF TOURN SGL QUICK 34 (TOURNIQUET CUFF) ×3
CUFF TRNQT CYL 34X4.125X (TOURNIQUET CUFF) ×1 IMPLANT
DRAPE C-ARM 42X120 X-RAY (DRAPES) ×3 IMPLANT
DRAPE U-SHAPE 47X51 STRL (DRAPES) ×3 IMPLANT
DRSG ADAPTIC 3X8 NADH LF (GAUZE/BANDAGES/DRESSINGS) ×3 IMPLANT
DRSG PAD ABDOMINAL 8X10 ST (GAUZE/BANDAGES/DRESSINGS) ×3 IMPLANT
ELECT REM PT RETURN 15FT ADLT (MISCELLANEOUS) ×3 IMPLANT
GAUZE SPONGE 4X4 12PLY STRL (GAUZE/BANDAGES/DRESSINGS) ×3 IMPLANT
GLOVE BIO SURGEON STRL SZ7.5 (GLOVE) ×3 IMPLANT
GLOVE SURG ORTHO 8.0 STRL STRW (GLOVE) ×3 IMPLANT
GOWN STRL REUS W/TWL XL LVL3 (GOWN DISPOSABLE) ×6 IMPLANT
GUIDEWIRE 1.35MM (WIRE) ×6 IMPLANT
K-WIRE BB-TAK (WIRE) ×3
KIT TURNOVER KIT A (KITS) IMPLANT
KWIRE BB-TAK (WIRE) ×1 IMPLANT
MANIFOLD NEPTUNE II (INSTRUMENTS) ×3 IMPLANT
PACK ORTHO EXTREMITY (CUSTOM PROCEDURE TRAY) ×3 IMPLANT
PAD CAST 4YDX4 CTTN HI CHSV (CAST SUPPLIES) ×2 IMPLANT
PADDING CAST COTTON 4X4 STRL (CAST SUPPLIES) ×6
PENCIL SMOKE EVACUATOR (MISCELLANEOUS) IMPLANT
PLATE LOCK DIST FIB 5H LT SS (Plate) ×3 IMPLANT
PROTECTOR NERVE ULNAR (MISCELLANEOUS) ×3 IMPLANT
SCREW COMP KREULOCK 2.7X10 (Screw) ×3 IMPLANT
SCREW COMP KREULOCK 2.7X14 (Screw) ×3 IMPLANT
SCREW COMP KREULOCK 2.7X16 (Screw) ×6 IMPLANT
SCREW COMP KREULOCK 3.5X16 (Screw) ×3 IMPLANT
SCREW LOCKING 2.7X16MM (Screw) ×3 IMPLANT
SCREW LOW PROFILE 3.5X14 (Screw) ×3 IMPLANT
SCREW LOW PROFILE 3.5X16 (Screw) ×3 IMPLANT
SCREW LOW PROFILE 4.0X40 (Screw) ×6 IMPLANT
STRIP CLOSURE SKIN 1/2X4 (GAUZE/BANDAGES/DRESSINGS) ×2 IMPLANT
SUT MNCRL AB 4-0 PS2 18 (SUTURE) ×3 IMPLANT
SUT VIC AB 1 CT1 27 (SUTURE) ×6
SUT VIC AB 1 CT1 27XBRD ANTBC (SUTURE) ×2 IMPLANT
SUT VIC AB 2-0 CT1 27 (SUTURE) ×3
SUT VIC AB 2-0 CT1 TAPERPNT 27 (SUTURE) ×1 IMPLANT
TOWEL OR 17X26 10 PK STRL BLUE (TOWEL DISPOSABLE) ×6 IMPLANT

## 2020-09-09 NOTE — Anesthesia Procedure Notes (Signed)
Procedure Name: Intubation Date/Time: 09/09/2020 12:50 PM Performed by: Eben Burow, CRNA Pre-anesthesia Checklist: Patient identified, Emergency Drugs available, Suction available, Patient being monitored and Timeout performed Patient Re-evaluated:Patient Re-evaluated prior to induction Oxygen Delivery Method: Circle system utilized Preoxygenation: Pre-oxygenation with 100% oxygen Induction Type: IV induction Ventilation: Mask ventilation without difficulty Laryngoscope Size: Glidescope and 3 Tube type: Oral Tube size: 7.0 mm Number of attempts: 1 Airway Equipment and Method: Stylet Placement Confirmation: ETT inserted through vocal cords under direct vision,  positive ETCO2 and breath sounds checked- equal and bilateral Secured at: 22 cm Tube secured with: Tape Dental Injury: Teeth and Oropharynx as per pre-operative assessment

## 2020-09-09 NOTE — Anesthesia Procedure Notes (Signed)
Procedure Name: LMA Insertion Date/Time: 09/09/2020 12:33 PM Performed by: Eben Burow, CRNA Pre-anesthesia Checklist: Patient identified, Emergency Drugs available, Suction available, Patient being monitored and Timeout performed Patient Re-evaluated:Patient Re-evaluated prior to induction Oxygen Delivery Method: Circle system utilized Preoxygenation: Pre-oxygenation with 100% oxygen Induction Type: IV induction Ventilation: Mask ventilation without difficulty LMA: LMA inserted and LMA with gastric port inserted LMA Size: 4.0 Number of attempts: 1 Tube secured with: Tape Dental Injury: Teeth and Oropharynx as per pre-operative assessment

## 2020-09-09 NOTE — Anesthesia Procedure Notes (Signed)
Anesthesia Regional Block: Popliteal block   Pre-Anesthetic Checklist: ,, timeout performed, Correct Patient, Correct Site, Correct Laterality, Correct Procedure, Correct Position, site marked, Risks and benefits discussed, pre-op evaluation,  At surgeon's request and post-op pain management  Laterality: Left and Lower  Prep: Maximum Sterile Barrier Precautions used, chloraprep       Needles:  Injection technique: Single-shot  Needle Type: Echogenic Needle     Needle Length: 9cm  Needle Gauge: 21     Additional Needles:   Procedures:,,,, ultrasound used (permanent image in chart),,,,  Narrative:  Start time: 09/09/2020 10:36 AM End time: 09/09/2020 10:43 AM Injection made incrementally with aspirations every 5 mL.  Performed by: Personally  Anesthesiologist: Barnet Glasgow, MD  Additional Notes: Block assessed. Patient tolerated procedure well.

## 2020-09-09 NOTE — Interval H&P Note (Signed)
History and Physical Interval Note:  09/09/2020 12:03 PM  Christina Walsh  has presented today for surgery, with the diagnosis of LEFT ANKLE FRACTURE.  The various methods of treatment have been discussed with the patient and family. After consideration of risks, benefits and other options for treatment, the patient has consented to  Procedure(s): OPEN REDUCTION INTERNAL FIXATION (ORIF) ANKLE FRACTURE (Left) as a surgical intervention.  The patient's history has been reviewed, patient examined, no change in status, stable for surgery.  I have reviewed the patient's chart and labs.  Questions were answered to the patient's satisfaction.     [name]

## 2020-09-09 NOTE — Anesthesia Procedure Notes (Signed)
Procedure Name: MAC Date/Time: 09/09/2020 12:12 PM Performed by: Eben Burow, CRNA Pre-anesthesia Checklist: Patient identified, Emergency Drugs available, Suction available, Patient being monitored and Timeout performed Oxygen Delivery Method: Simple face mask Placement Confirmation: positive ETCO2

## 2020-09-09 NOTE — Discharge Instructions (Signed)
Diet: As you were doing prior to hospitalization   Activity: Increase activity slowly as tolerated  No lifting or driving for 6 weeks   Shower: May shower must keep splint covered and dry.  Dressing: do not remove splint, keep in place until follow up.   Weight Bearing:  nonweight bearing operative leg. Use a walker or  Crutches as instructed.   To prevent constipation: you may use a stool softener such as -  Colace ( over the counter) 100 mg by mouth twice a day  Drink plenty of fluids ( prune juice may be helpful) and high fiber foods  Miralax ( over the counter) for constipation as needed.   Precautions: If you experience chest pain or shortness of breath - call 911 immediately For transfer to the hospital emergency department!!  If you develop a fever greater that 101 F, purulent drainage from wound, increased redness or drainage from wound, or calf pain -- Call the office   Follow- Up Appointment: Please call for an appointment to be seen in 2 weeks  [facility] - [phone]

## 2020-09-09 NOTE — Progress Notes (Signed)
AssistedDr. Valma Cava with left, ultrasound guided, popliteal, adductor canal block. Side rails up, monitors on throughout procedure. See vital signs in flow sheet. Tolerated Procedure well.

## 2020-09-09 NOTE — Anesthesia Procedure Notes (Signed)
Anesthesia Regional Block: Adductor canal block   Pre-Anesthetic Checklist: ,, timeout performed, Correct Patient, Correct Site, Correct Laterality, Correct Procedure, Correct Position, site marked, Risks and benefits discussed,  Surgical consent,  Pre-op evaluation,  At surgeon's request and post-op pain management  Laterality: Lower and Left  Prep: chloraprep       Needles:  Injection technique: Single-shot  Needle Type: Echogenic Needle     Needle Length: 9cm  Needle Gauge: 22     Additional Needles:   Procedures:,,,, ultrasound used (permanent image in chart),,,,  Narrative:  Start time: 09/09/2020 10:44 AM End time: 09/09/2020 10:50 AM Injection made incrementally with aspirations every 5 mL.  Performed by: Personally  Anesthesiologist: Barnet Glasgow, MD  Additional Notes: Block assessed prior to surgery. Pt tolerated procedure well.

## 2020-09-09 NOTE — Transfer of Care (Signed)
Immediate Anesthesia Transfer of Care Note  Patient: Christina Walsh  Procedure(s) Performed: OPEN REDUCTION INTERNAL FIXATION (ORIF) ANKLE FRACTURE (Left Ankle)  Patient Location: PACU  Anesthesia Type:GA combined with regional for post-op pain  Level of Consciousness: awake, oriented, patient cooperative and responds to stimulation  Airway & Oxygen Therapy: Patient Spontanous Breathing and Patient connected to face mask oxygen  Post-op Assessment: Report given to RN and Post -op Vital signs reviewed and stable  Post vital signs: Reviewed and stable  Last Vitals:  Vitals Value Taken Time  BP 157/87 09/09/20 1421  Temp 36.9 C 09/09/20 1421  Pulse 81 09/09/20 1422  Resp 18 09/09/20 1422  SpO2 98 % 09/09/20 1422  Vitals shown include unvalidated device data.  Last Pain:  Vitals:   09/09/20 1049  TempSrc:   PainSc: 4       Patients Stated Pain Goal: 4 (83/38/25 0539)  Complications: No complications documented.

## 2020-09-09 NOTE — Anesthesia Postprocedure Evaluation (Signed)
Anesthesia Post Note  Patient: Christina Walsh  Procedure(s) Performed: OPEN REDUCTION INTERNAL FIXATION (ORIF) ANKLE FRACTURE (Left Ankle)     Patient location during evaluation: PACU Anesthesia Type: Regional and General Level of consciousness: awake and alert Pain management: pain level controlled Vital Signs Assessment: post-procedure vital signs reviewed and stable Respiratory status: spontaneous breathing, nonlabored ventilation, respiratory function stable and patient connected to nasal cannula oxygen Cardiovascular status: blood pressure returned to baseline and stable Postop Assessment: no apparent nausea or vomiting Anesthetic complications: no   No complications documented.  Last Vitals:  Vitals:   09/09/20 1515 09/09/20 1520  BP: 125/85 (!) 149/78  Pulse: 72 80  Resp: 20   Temp: 37 C 36.9 C  SpO2: 91% 91%    Last Pain:  Vitals:   09/09/20 1520  TempSrc:   PainSc: 0-No pain                 Caylee Vlachos

## 2020-09-09 NOTE — Op Note (Signed)
NAME: Christina Walsh, Christina Walsh MEDICAL RECORD [id number] ACCOUNT [id number] DATE OF BIRTH:[birth year] FACILITY: [facility] LOCATION: [facility] PHYSICIAN:W. Kassondra Geil JR., MD  OPERATIVE REPORT  DATE OF PROCEDURE:  09/09/2020  PREOPERATIVE DIAGNOSIS:  Displaced left bimalleolar ankle fracture.  POSTOPERATIVE DIAGNOSIS:  Displaced left bimalleolar ankle fracture.  OPERATION:  Open reduction internal fixation, left bimalleolar ankle fracture (Arthrex fibular anatomic plate, locking 5-hole, with two 4.0 cancellous screws for medial malleolus fixation).  SURGEON:  W. Carmine Savoy., MD  ASSISTANTMarjo Bicker, PA  ANESTHESIA:  Regional block with general.  TOURNIQUET TIME:  70 minutes.  DESCRIPTION OF PROCEDURE:  Lateral incision was made over displaced fibular fracture, exposed the fracture and reduced it without difficulty, placed the Arthrex fibular plate with a cluster of locking screws distally.  A good purchase on the cortical  bone and proximally was made with a 5-hole plate.  We had to leave one hole where the screw had stripped open, but we got good proximal and distal fixation, confirmed reduction as well as appropriate screw length in the AP, lateral and mortise views.   Small curvilinear incision was made on the medial side of the ankle, relatively small fragment of the anterior portion of the medial malleolus was identified, more in transverse orientation, very small piece of bone but we were able to reduce it and get  2 reasonably good cancellous screws, 4.0, into this fragment, reducing the mortise nicely, confirming position and length with the fluoroscopy.  Final fluoroscopy images were made as well.  The wound was closed with 2-0 Vicryl and Monocryl in the skin.   A lightly compressive sterile dressing was applied with a posterior splint.  Taken to recovery room in stable condition.  HN/NUANCE  D:09/09/2020 T:09/09/2020 JOB:[id number]

## 2020-09-09 NOTE — Brief Op Note (Signed)
09/09/2020  2:17 PM  PATIENT:  Christina Walsh  64 y.o. female  PRE-OPERATIVE DIAGNOSIS:  LEFT ANKLE FRACTURE  POST-OPERATIVE DIAGNOSIS:  LEFT ANKLE FRACTURE  PROCEDURE:  Procedure(s): OPEN REDUCTION INTERNAL FIXATION (ORIF) ANKLE FRACTURE (Left)  SURGEON:  Surgeon(s) and Role:    * Earlie Server, MD - Primary  PHYSICIAN ASSISTANT: Chriss Czar, PA-C  ASSISTANTS:   ANESTHESIA:   regional, general, IV sedation and MAC  EBL:  10 mL   BLOOD ADMINISTERED:none  DRAINS: none   LOCAL MEDICATIONS USED:  NONE  SPECIMEN:  No Specimen  DISPOSITION OF SPECIMEN:  N/A  COUNTS:  YES  TOURNIQUET:   Total Tourniquet Time Documented: Thigh (Left) - 89 minutes Total: Thigh (Left) - 89 minutes   DICTATION: .Other Dictation: Dictation Number unknown  PLAN OF CARE: Discharge to home after PACU  PATIENT DISPOSITION:  PACU - hemodynamically stable.   Delay start of Pharmacological VTE agent (>24hrs) due to surgical blood loss or risk of bleeding: yes

## 2020-09-10 ENCOUNTER — Encounter (HOSPITAL_COMMUNITY): Payer: Self-pay | Admitting: Orthopedic Surgery

## 2020-09-21 DIAGNOSIS — S82842D Displaced bimalleolar fracture of left lower leg, subsequent encounter for closed fracture with routine healing: Secondary | ICD-10-CM | POA: Diagnosis not present

## 2020-10-05 DIAGNOSIS — I10 Essential (primary) hypertension: Secondary | ICD-10-CM | POA: Diagnosis not present

## 2020-10-05 DIAGNOSIS — J449 Chronic obstructive pulmonary disease, unspecified: Secondary | ICD-10-CM | POA: Diagnosis not present

## 2020-10-05 DIAGNOSIS — Z8781 Personal history of (healed) traumatic fracture: Secondary | ICD-10-CM | POA: Diagnosis not present

## 2020-10-07 DIAGNOSIS — K219 Gastro-esophageal reflux disease without esophagitis: Secondary | ICD-10-CM | POA: Diagnosis not present

## 2020-10-07 DIAGNOSIS — F325 Major depressive disorder, single episode, in full remission: Secondary | ICD-10-CM | POA: Diagnosis not present

## 2020-10-07 DIAGNOSIS — J449 Chronic obstructive pulmonary disease, unspecified: Secondary | ICD-10-CM | POA: Diagnosis not present

## 2020-10-07 DIAGNOSIS — E7800 Pure hypercholesterolemia, unspecified: Secondary | ICD-10-CM | POA: Diagnosis not present

## 2020-10-07 DIAGNOSIS — J441 Chronic obstructive pulmonary disease with (acute) exacerbation: Secondary | ICD-10-CM | POA: Diagnosis not present

## 2020-10-07 DIAGNOSIS — M1610 Unilateral primary osteoarthritis, unspecified hip: Secondary | ICD-10-CM | POA: Diagnosis not present

## 2020-10-07 DIAGNOSIS — I10 Essential (primary) hypertension: Secondary | ICD-10-CM | POA: Diagnosis not present

## 2020-10-07 DIAGNOSIS — G459 Transient cerebral ischemic attack, unspecified: Secondary | ICD-10-CM | POA: Diagnosis not present

## 2020-10-07 DIAGNOSIS — F3341 Major depressive disorder, recurrent, in partial remission: Secondary | ICD-10-CM | POA: Diagnosis not present

## 2020-10-12 DIAGNOSIS — S82842D Displaced bimalleolar fracture of left lower leg, subsequent encounter for closed fracture with routine healing: Secondary | ICD-10-CM | POA: Diagnosis not present

## 2020-11-16 DIAGNOSIS — S82842D Displaced bimalleolar fracture of left lower leg, subsequent encounter for closed fracture with routine healing: Secondary | ICD-10-CM | POA: Diagnosis not present

## 2020-11-16 DIAGNOSIS — M47812 Spondylosis without myelopathy or radiculopathy, cervical region: Secondary | ICD-10-CM | POA: Diagnosis not present

## 2020-11-18 DIAGNOSIS — K219 Gastro-esophageal reflux disease without esophagitis: Secondary | ICD-10-CM | POA: Diagnosis not present

## 2020-11-18 DIAGNOSIS — M1610 Unilateral primary osteoarthritis, unspecified hip: Secondary | ICD-10-CM | POA: Diagnosis not present

## 2020-11-18 DIAGNOSIS — I10 Essential (primary) hypertension: Secondary | ICD-10-CM | POA: Diagnosis not present

## 2020-11-18 DIAGNOSIS — F3341 Major depressive disorder, recurrent, in partial remission: Secondary | ICD-10-CM | POA: Diagnosis not present

## 2020-11-18 DIAGNOSIS — E7800 Pure hypercholesterolemia, unspecified: Secondary | ICD-10-CM | POA: Diagnosis not present

## 2020-11-18 DIAGNOSIS — J449 Chronic obstructive pulmonary disease, unspecified: Secondary | ICD-10-CM | POA: Diagnosis not present

## 2020-11-18 DIAGNOSIS — J441 Chronic obstructive pulmonary disease with (acute) exacerbation: Secondary | ICD-10-CM | POA: Diagnosis not present

## 2020-11-18 DIAGNOSIS — I2510 Atherosclerotic heart disease of native coronary artery without angina pectoris: Secondary | ICD-10-CM | POA: Diagnosis not present

## 2020-11-18 DIAGNOSIS — G459 Transient cerebral ischemic attack, unspecified: Secondary | ICD-10-CM | POA: Diagnosis not present

## 2020-11-23 DIAGNOSIS — M25672 Stiffness of left ankle, not elsewhere classified: Secondary | ICD-10-CM | POA: Diagnosis not present

## 2020-11-23 DIAGNOSIS — M6281 Muscle weakness (generalized): Secondary | ICD-10-CM | POA: Diagnosis not present

## 2020-11-23 DIAGNOSIS — M25572 Pain in left ankle and joints of left foot: Secondary | ICD-10-CM | POA: Diagnosis not present

## 2020-11-23 DIAGNOSIS — R262 Difficulty in walking, not elsewhere classified: Secondary | ICD-10-CM | POA: Diagnosis not present

## 2020-11-25 DIAGNOSIS — M542 Cervicalgia: Secondary | ICD-10-CM | POA: Diagnosis not present

## 2020-11-25 DIAGNOSIS — Z23 Encounter for immunization: Secondary | ICD-10-CM | POA: Diagnosis not present

## 2020-11-25 DIAGNOSIS — Z7185 Encounter for immunization safety counseling: Secondary | ICD-10-CM | POA: Diagnosis not present

## 2020-11-25 DIAGNOSIS — M5412 Radiculopathy, cervical region: Secondary | ICD-10-CM | POA: Diagnosis not present

## 2020-12-01 DIAGNOSIS — M6281 Muscle weakness (generalized): Secondary | ICD-10-CM | POA: Diagnosis not present

## 2020-12-01 DIAGNOSIS — R262 Difficulty in walking, not elsewhere classified: Secondary | ICD-10-CM | POA: Diagnosis not present

## 2020-12-01 DIAGNOSIS — M25672 Stiffness of left ankle, not elsewhere classified: Secondary | ICD-10-CM | POA: Diagnosis not present

## 2020-12-01 DIAGNOSIS — M25572 Pain in left ankle and joints of left foot: Secondary | ICD-10-CM | POA: Diagnosis not present

## 2020-12-04 DIAGNOSIS — M25572 Pain in left ankle and joints of left foot: Secondary | ICD-10-CM | POA: Diagnosis not present

## 2020-12-04 DIAGNOSIS — R262 Difficulty in walking, not elsewhere classified: Secondary | ICD-10-CM | POA: Diagnosis not present

## 2020-12-04 DIAGNOSIS — M25672 Stiffness of left ankle, not elsewhere classified: Secondary | ICD-10-CM | POA: Diagnosis not present

## 2020-12-04 DIAGNOSIS — M6281 Muscle weakness (generalized): Secondary | ICD-10-CM | POA: Diagnosis not present

## 2020-12-07 DIAGNOSIS — M25672 Stiffness of left ankle, not elsewhere classified: Secondary | ICD-10-CM | POA: Diagnosis not present

## 2020-12-07 DIAGNOSIS — R262 Difficulty in walking, not elsewhere classified: Secondary | ICD-10-CM | POA: Diagnosis not present

## 2020-12-07 DIAGNOSIS — M6281 Muscle weakness (generalized): Secondary | ICD-10-CM | POA: Diagnosis not present

## 2020-12-07 DIAGNOSIS — M25572 Pain in left ankle and joints of left foot: Secondary | ICD-10-CM | POA: Diagnosis not present

## 2020-12-11 DIAGNOSIS — M79602 Pain in left arm: Secondary | ICD-10-CM | POA: Diagnosis not present

## 2020-12-11 DIAGNOSIS — R29898 Other symptoms and signs involving the musculoskeletal system: Secondary | ICD-10-CM | POA: Diagnosis not present

## 2020-12-14 DIAGNOSIS — M25572 Pain in left ankle and joints of left foot: Secondary | ICD-10-CM | POA: Diagnosis not present

## 2021-01-06 DIAGNOSIS — M65331 Trigger finger, right middle finger: Secondary | ICD-10-CM | POA: Diagnosis not present

## 2021-01-06 DIAGNOSIS — M65341 Trigger finger, right ring finger: Secondary | ICD-10-CM | POA: Diagnosis not present

## 2021-01-06 DIAGNOSIS — M65342 Trigger finger, left ring finger: Secondary | ICD-10-CM | POA: Diagnosis not present

## 2021-01-06 DIAGNOSIS — M65332 Trigger finger, left middle finger: Secondary | ICD-10-CM | POA: Diagnosis not present

## 2021-02-11 DIAGNOSIS — J441 Chronic obstructive pulmonary disease with (acute) exacerbation: Secondary | ICD-10-CM | POA: Diagnosis not present

## 2021-02-11 DIAGNOSIS — I2510 Atherosclerotic heart disease of native coronary artery without angina pectoris: Secondary | ICD-10-CM | POA: Diagnosis not present

## 2021-02-11 DIAGNOSIS — K219 Gastro-esophageal reflux disease without esophagitis: Secondary | ICD-10-CM | POA: Diagnosis not present

## 2021-02-11 DIAGNOSIS — M1610 Unilateral primary osteoarthritis, unspecified hip: Secondary | ICD-10-CM | POA: Diagnosis not present

## 2021-02-11 DIAGNOSIS — E7800 Pure hypercholesterolemia, unspecified: Secondary | ICD-10-CM | POA: Diagnosis not present

## 2021-02-11 DIAGNOSIS — F325 Major depressive disorder, single episode, in full remission: Secondary | ICD-10-CM | POA: Diagnosis not present

## 2021-02-11 DIAGNOSIS — J449 Chronic obstructive pulmonary disease, unspecified: Secondary | ICD-10-CM | POA: Diagnosis not present

## 2021-02-11 DIAGNOSIS — G459 Transient cerebral ischemic attack, unspecified: Secondary | ICD-10-CM | POA: Diagnosis not present

## 2021-02-11 DIAGNOSIS — I10 Essential (primary) hypertension: Secondary | ICD-10-CM | POA: Diagnosis not present

## 2021-02-17 DIAGNOSIS — M25532 Pain in left wrist: Secondary | ICD-10-CM | POA: Diagnosis not present

## 2021-02-17 DIAGNOSIS — M79632 Pain in left forearm: Secondary | ICD-10-CM | POA: Diagnosis not present

## 2021-02-23 DIAGNOSIS — G4733 Obstructive sleep apnea (adult) (pediatric): Secondary | ICD-10-CM | POA: Diagnosis not present

## 2021-02-23 DIAGNOSIS — J449 Chronic obstructive pulmonary disease, unspecified: Secondary | ICD-10-CM | POA: Diagnosis not present

## 2021-02-23 DIAGNOSIS — Z0000 Encounter for general adult medical examination without abnormal findings: Secondary | ICD-10-CM | POA: Diagnosis not present

## 2021-02-23 DIAGNOSIS — E7800 Pure hypercholesterolemia, unspecified: Secondary | ICD-10-CM | POA: Diagnosis not present

## 2021-02-23 DIAGNOSIS — I10 Essential (primary) hypertension: Secondary | ICD-10-CM | POA: Diagnosis not present

## 2021-02-23 DIAGNOSIS — Z6841 Body Mass Index (BMI) 40.0 and over, adult: Secondary | ICD-10-CM | POA: Diagnosis not present

## 2021-02-23 DIAGNOSIS — K219 Gastro-esophageal reflux disease without esophagitis: Secondary | ICD-10-CM | POA: Diagnosis not present

## 2021-02-23 DIAGNOSIS — Z23 Encounter for immunization: Secondary | ICD-10-CM | POA: Diagnosis not present

## 2021-02-23 DIAGNOSIS — F325 Major depressive disorder, single episode, in full remission: Secondary | ICD-10-CM | POA: Diagnosis not present

## 2021-02-25 DIAGNOSIS — G5632 Lesion of radial nerve, left upper limb: Secondary | ICD-10-CM | POA: Diagnosis not present

## 2021-02-25 DIAGNOSIS — M79642 Pain in left hand: Secondary | ICD-10-CM | POA: Diagnosis not present

## 2021-02-25 DIAGNOSIS — M79641 Pain in right hand: Secondary | ICD-10-CM | POA: Diagnosis not present

## 2021-03-17 DIAGNOSIS — G4733 Obstructive sleep apnea (adult) (pediatric): Secondary | ICD-10-CM | POA: Diagnosis not present

## 2021-03-24 ENCOUNTER — Other Ambulatory Visit (HOSPITAL_BASED_OUTPATIENT_CLINIC_OR_DEPARTMENT_OTHER): Payer: Self-pay

## 2021-03-24 DIAGNOSIS — G4710 Hypersomnia, unspecified: Secondary | ICD-10-CM

## 2021-03-24 DIAGNOSIS — R0683 Snoring: Secondary | ICD-10-CM

## 2021-03-24 DIAGNOSIS — R0681 Apnea, not elsewhere classified: Secondary | ICD-10-CM

## 2021-04-07 DIAGNOSIS — G5632 Lesion of radial nerve, left upper limb: Secondary | ICD-10-CM | POA: Diagnosis not present

## 2021-04-14 ENCOUNTER — Other Ambulatory Visit: Payer: Self-pay

## 2021-04-14 ENCOUNTER — Ambulatory Visit
Admission: RE | Admit: 2021-04-14 | Discharge: 2021-04-14 | Disposition: A | Discharge status: Home / Self Care | Payer: Medicare HMO | Source: Ambulatory Visit | Attending: Internal Medicine | Admitting: Internal Medicine

## 2021-04-14 DIAGNOSIS — F3341 Major depressive disorder, recurrent, in partial remission: Secondary | ICD-10-CM | POA: Diagnosis not present

## 2021-04-14 DIAGNOSIS — M1610 Unilateral primary osteoarthritis, unspecified hip: Secondary | ICD-10-CM | POA: Diagnosis not present

## 2021-04-14 DIAGNOSIS — G459 Transient cerebral ischemic attack, unspecified: Secondary | ICD-10-CM | POA: Diagnosis not present

## 2021-04-14 DIAGNOSIS — Z1231 Encounter for screening mammogram for malignant neoplasm of breast: Secondary | ICD-10-CM

## 2021-04-14 DIAGNOSIS — I10 Essential (primary) hypertension: Secondary | ICD-10-CM | POA: Diagnosis not present

## 2021-04-14 DIAGNOSIS — E7800 Pure hypercholesterolemia, unspecified: Secondary | ICD-10-CM | POA: Diagnosis not present

## 2021-04-14 DIAGNOSIS — I2510 Atherosclerotic heart disease of native coronary artery without angina pectoris: Secondary | ICD-10-CM | POA: Diagnosis not present

## 2021-04-14 DIAGNOSIS — J441 Chronic obstructive pulmonary disease with (acute) exacerbation: Secondary | ICD-10-CM | POA: Diagnosis not present

## 2021-04-14 DIAGNOSIS — F325 Major depressive disorder, single episode, in full remission: Secondary | ICD-10-CM | POA: Diagnosis not present

## 2021-04-14 DIAGNOSIS — J449 Chronic obstructive pulmonary disease, unspecified: Secondary | ICD-10-CM | POA: Diagnosis not present

## 2021-04-16 ENCOUNTER — Other Ambulatory Visit: Payer: Self-pay | Admitting: Internal Medicine

## 2021-04-16 DIAGNOSIS — R928 Other abnormal and inconclusive findings on diagnostic imaging of breast: Secondary | ICD-10-CM

## 2021-04-21 DIAGNOSIS — M79641 Pain in right hand: Secondary | ICD-10-CM | POA: Diagnosis not present

## 2021-04-21 DIAGNOSIS — G5632 Lesion of radial nerve, left upper limb: Secondary | ICD-10-CM | POA: Diagnosis not present

## 2021-04-21 DIAGNOSIS — M79642 Pain in left hand: Secondary | ICD-10-CM | POA: Diagnosis not present

## 2021-05-03 ENCOUNTER — Other Ambulatory Visit: Payer: Self-pay | Admitting: Internal Medicine

## 2021-05-03 ENCOUNTER — Other Ambulatory Visit: Payer: Self-pay

## 2021-05-03 ENCOUNTER — Ambulatory Visit
Admission: RE | Admit: 2021-05-03 | Discharge: 2021-05-03 | Disposition: A | Discharge status: Home / Self Care | Payer: Medicare HMO | Source: Ambulatory Visit | Attending: Internal Medicine | Admitting: Internal Medicine

## 2021-05-03 DIAGNOSIS — R928 Other abnormal and inconclusive findings on diagnostic imaging of breast: Secondary | ICD-10-CM

## 2021-05-03 DIAGNOSIS — N6310 Unspecified lump in the right breast, unspecified quadrant: Secondary | ICD-10-CM

## 2021-05-09 ENCOUNTER — Other Ambulatory Visit: Payer: Self-pay

## 2021-05-09 ENCOUNTER — Ambulatory Visit (HOSPITAL_BASED_OUTPATIENT_CLINIC_OR_DEPARTMENT_OTHER): Payer: Medicare HMO | Attending: Internal Medicine | Admitting: Internal Medicine

## 2021-05-09 VITALS — Ht 60.0 in | Wt 200.0 lb

## 2021-05-09 DIAGNOSIS — G4736 Sleep related hypoventilation in conditions classified elsewhere: Secondary | ICD-10-CM | POA: Insufficient documentation

## 2021-05-09 DIAGNOSIS — G4710 Hypersomnia, unspecified: Secondary | ICD-10-CM

## 2021-05-09 DIAGNOSIS — G4733 Obstructive sleep apnea (adult) (pediatric): Secondary | ICD-10-CM | POA: Diagnosis not present

## 2021-05-09 DIAGNOSIS — R0683 Snoring: Secondary | ICD-10-CM | POA: Insufficient documentation

## 2021-05-09 DIAGNOSIS — R0681 Apnea, not elsewhere classified: Secondary | ICD-10-CM

## 2021-05-18 DIAGNOSIS — G4733 Obstructive sleep apnea (adult) (pediatric): Secondary | ICD-10-CM | POA: Diagnosis not present

## 2021-05-21 DIAGNOSIS — G5622 Lesion of ulnar nerve, left upper limb: Secondary | ICD-10-CM | POA: Diagnosis not present

## 2021-05-21 DIAGNOSIS — G5632 Lesion of radial nerve, left upper limb: Secondary | ICD-10-CM | POA: Diagnosis not present

## 2021-05-21 DIAGNOSIS — M67844 Other specified disorders of tendon, left hand: Secondary | ICD-10-CM | POA: Diagnosis not present

## 2021-05-24 NOTE — Procedures (Signed)
   NAME: Christina Walsh DATE OF BIRTH:  May 23, 1956 MEDICAL RECORD NUMBER [id number]  LOCATION: [facility]  PHYSICIAN: Marius Ditch  DATE OF STUDY: 05/09/2021  SLEEP STUDY TYPE: Positive Airway Pressure Titration               REFERRING PHYSICIAN: Marius Ditch, MD  EPWORTH SLEEPINESS SCORE:  9 HEIGHT: 5' (152.4 cm)  WEIGHT: 200 lb (90.7 kg)    Body mass index is 39.06 kg/m.  NECK SIZE: 17 in.  CLINICAL INFORMATION The patient was referred to the sleep center for PAP titration in the face of moderate OSA (AHI 22/hr on HSAT), severe hypoxemia in sleep (71% of recorded time on HSAT with O2 sat < 89%) and moderate COPD by spirometry in 2019.  MEDICATIONS No sleep medicine administered.Marland Kitchen  SLEEP STUDY TECHNIQUE The patient underwent an attended overnight polysomnography titration to assess the effects of cpap therapy. The following variables were monitored: EEG(C4-A1, C3-A2, O1-A2, O2-A1), EOG, submental and leg EMG, ECG, oxyhemoglobin saturation by pulse oximetry, thoracic and abdominal respiratory effort belts, nasal/oral airflow by pressure sensor, body position sensor and snoring sensor. CPAP pressure was titrated to eliminate apneas, hypopneas and oxygen desaturation.  TECHNICAL COMMENTS Comments added by Technician: Patient had difficulty initiating sleep. THE PATIENT TOLERATED THE MASK GOOD. NO OXYGEN THERAPY WAS STARTED Comments added by Scorer: N/A  SLEEP ARCHITECTURE The study was initiated at 10:44:15 PM and terminated at 4:54:57 AM. Total recorded time was 370.7 minutes. EEG confirmed total sleep time was 240.5 minutes yielding a sleep efficiency of 64.9%%. Sleep onset after lights out was 33.5 minutes with a REM latency of 319.0 minutes. The patient spent 8.7%% of the night in stage N1 sleep, 83.8%% in stage N2 sleep, 0.0%% in stage N3 and 7.5% in REM. The Arousal Index was 1.0/hour.  RESPIRATORY PARAMETERS The overall AHI was 16.2 per hour, and  the RDI was 16.5 events/hour with a central apnea index of 0.2 per hour. The last adjusted setting of PAP was IPAP/EPAP 13/9 cm H2O. At this setting, the sleep efficiency was 95% and the patient was supine for 100%. However, at this setting, AHI was 37.5 events per hour, the RDI was 37.5 events/hour (with 0.2 central events) the arousal index was 0 per hour and the oxygen nadir was 75.0%. The patient was converted from CPAP to BPAP for comfort reasons.   LEG MOVEMENT DATA The total leg movements were 76 with a resulting leg movement index of 19.0/hr. Associated arousal with leg movement index was 0.0/hr.  CARDIAC DATA The underlying cardiac rhythm was most consistent with sinus rhythm. Mean heart rate during sleep was 66.6 bpm. Additional rhythm abnormalities include None.  IMPRESSIONS - Moderate Obstructive Sleep apnea (OSA) and severe oxygen desaturation in sleep. - Optimal pressures and oxygenation not found. - No Significant Central Sleep Apnea (CSA) - Reduced sleep efficiency  DIAGNOSIS - Obstructive Sleep Apnea (G47.33) - Nocturnal Hypoxemia (G47.36)  RECOMMENDATIONS - This test did not result in an adequate treatment for the patient. She should return for BPAP titration with supplement oxygen if needed.   Marius Ditch Sleep specialist, [place] [employer]  ELECTRONICALLY SIGNED ON:  05/24/2021, 8:19 PM [place] PH: [phone]   FX: [phone] [place]

## 2021-06-02 DIAGNOSIS — R29898 Other symptoms and signs involving the musculoskeletal system: Secondary | ICD-10-CM | POA: Diagnosis not present

## 2021-06-09 DIAGNOSIS — G459 Transient cerebral ischemic attack, unspecified: Secondary | ICD-10-CM | POA: Diagnosis not present

## 2021-06-09 DIAGNOSIS — I10 Essential (primary) hypertension: Secondary | ICD-10-CM | POA: Diagnosis not present

## 2021-06-09 DIAGNOSIS — K219 Gastro-esophageal reflux disease without esophagitis: Secondary | ICD-10-CM | POA: Diagnosis not present

## 2021-06-09 DIAGNOSIS — I2510 Atherosclerotic heart disease of native coronary artery without angina pectoris: Secondary | ICD-10-CM | POA: Diagnosis not present

## 2021-06-09 DIAGNOSIS — J449 Chronic obstructive pulmonary disease, unspecified: Secondary | ICD-10-CM | POA: Diagnosis not present

## 2021-06-09 DIAGNOSIS — F325 Major depressive disorder, single episode, in full remission: Secondary | ICD-10-CM | POA: Diagnosis not present

## 2021-06-09 DIAGNOSIS — R29898 Other symptoms and signs involving the musculoskeletal system: Secondary | ICD-10-CM | POA: Diagnosis not present

## 2021-06-09 DIAGNOSIS — E7800 Pure hypercholesterolemia, unspecified: Secondary | ICD-10-CM | POA: Diagnosis not present

## 2021-06-09 DIAGNOSIS — J441 Chronic obstructive pulmonary disease with (acute) exacerbation: Secondary | ICD-10-CM | POA: Diagnosis not present

## 2021-06-09 DIAGNOSIS — M1610 Unilateral primary osteoarthritis, unspecified hip: Secondary | ICD-10-CM | POA: Diagnosis not present

## 2021-06-23 ENCOUNTER — Other Ambulatory Visit (HOSPITAL_BASED_OUTPATIENT_CLINIC_OR_DEPARTMENT_OTHER): Payer: Self-pay

## 2021-06-23 DIAGNOSIS — J441 Chronic obstructive pulmonary disease with (acute) exacerbation: Secondary | ICD-10-CM

## 2021-06-23 DIAGNOSIS — G4733 Obstructive sleep apnea (adult) (pediatric): Secondary | ICD-10-CM

## 2021-06-24 DIAGNOSIS — R29898 Other symptoms and signs involving the musculoskeletal system: Secondary | ICD-10-CM | POA: Diagnosis not present

## 2021-06-29 DIAGNOSIS — R29898 Other symptoms and signs involving the musculoskeletal system: Secondary | ICD-10-CM | POA: Diagnosis not present

## 2021-07-06 DIAGNOSIS — R197 Diarrhea, unspecified: Secondary | ICD-10-CM | POA: Diagnosis not present

## 2021-07-07 DIAGNOSIS — R197 Diarrhea, unspecified: Secondary | ICD-10-CM | POA: Diagnosis not present

## 2021-07-12 ENCOUNTER — Other Ambulatory Visit: Payer: Self-pay

## 2021-07-12 ENCOUNTER — Ambulatory Visit (HOSPITAL_BASED_OUTPATIENT_CLINIC_OR_DEPARTMENT_OTHER): Payer: Medicare HMO | Attending: Internal Medicine | Admitting: Internal Medicine

## 2021-07-12 DIAGNOSIS — J441 Chronic obstructive pulmonary disease with (acute) exacerbation: Secondary | ICD-10-CM | POA: Diagnosis not present

## 2021-07-12 DIAGNOSIS — Z23 Encounter for immunization: Secondary | ICD-10-CM | POA: Diagnosis not present

## 2021-07-12 DIAGNOSIS — G4733 Obstructive sleep apnea (adult) (pediatric): Secondary | ICD-10-CM | POA: Diagnosis not present

## 2021-07-12 DIAGNOSIS — K219 Gastro-esophageal reflux disease without esophagitis: Secondary | ICD-10-CM | POA: Diagnosis not present

## 2021-07-12 DIAGNOSIS — L659 Nonscarring hair loss, unspecified: Secondary | ICD-10-CM | POA: Diagnosis not present

## 2021-07-12 DIAGNOSIS — M542 Cervicalgia: Secondary | ICD-10-CM | POA: Diagnosis not present

## 2021-07-12 DIAGNOSIS — J449 Chronic obstructive pulmonary disease, unspecified: Secondary | ICD-10-CM | POA: Diagnosis not present

## 2021-07-12 DIAGNOSIS — I10 Essential (primary) hypertension: Secondary | ICD-10-CM | POA: Diagnosis not present

## 2021-07-13 DIAGNOSIS — R29898 Other symptoms and signs involving the musculoskeletal system: Secondary | ICD-10-CM | POA: Diagnosis not present

## 2021-07-14 DIAGNOSIS — G4733 Obstructive sleep apnea (adult) (pediatric): Secondary | ICD-10-CM | POA: Diagnosis not present

## 2021-07-20 NOTE — Procedures (Signed)
   NAME: Christina Walsh DATE OF BIRTH:  08-Mar-1956 MEDICAL RECORD NUMBER [id number]  LOCATION: [facility]  PHYSICIAN: Marius Ditch  DATE OF STUDY: 07/12/2021  SLEEP STUDY TYPE: Positive Airway Pressure Titration               REFERRING PHYSICIAN: Marius Ditch, MD  EPWORTH SLEEPINESS SCORE:   HEIGHT: 5' (152.4 cm)  WEIGHT: 200 lb (90.7 kg)    Body mass index is 39.06 kg/m.  NECK SIZE: 15.5 in.  CLINICAL INFORMATION The patient was referred to the sleep center for BPAP titration. She has moderate OSA based on HSAT with severe and persistent hypoxemia. She also has COPD. She underwent CPAP and then BPAP titration on 05/09/21 but this was not successful at resolving her OSA  MEDICATIONS Patient self administered medications include: TYLENOL, CLONIDINE, FAMOTIDINE, Vitamin - C, ASPIRIN, BIOTIN, Vitamin B6. No sleep medicine administered.  SLEEP STUDY TECHNIQUE The patient underwent an attended overnight polysomnography titration to assess the effects of cpap therapy. The following variables were monitored: EEG (C4-A1, C3-A2, O1-A2, O2-A1, F3-M2, F4-M1), EOG, submental and leg EMG, ECG, oxyhemoglobin saturation by pulse oximetry, thoracic and abdominal respiratory effort belts, nasal/oral airflow by pressure sensor, body position sensor and snoring sensor. CPAP pressure was titrated to eliminate apneas, hypopneas and oxygen desaturation.  TECHNICAL COMMENTS Comments added by Technician: Patient was ordered as a Bipap titration Comments added by Scorer: N/A  SLEEP ARCHITECTURE The study was initiated at 10:24:11 PM and terminated at 4:50:33 AM. Total recorded time was 386.4 minutes. EEG confirmed total sleep time was 292.5 minutes yielding a sleep efficiency of 75.7%%. Sleep onset after lights out was 48.3 minutes with a REM latency of 192.0 minutes. The patient spent 10.3%% of the night in stage N1 sleep, 66.2%% in stage N2 sleep, 0.0%% in stage N3 and 23.6% in  REM. The Arousal Index was 19.1/hour.  RESPIRATORY PARAMETERS The overall AHI was 13.5 per hour, and the RDI was 22.2 events/hour with a central apnea index of 1 per hour. She was started on BPAP and pressure was steadily increased Final setting of BiPAP was IPAP/EPAP 26/22 cm H2O. However, BPAP of 24/20 cm H20 was also highly effective.  The patient reported that her sleep during this study was better than her usual sleep at home.   LEG MOVEMENT DATA The total leg movements were 237 with a resulting leg movement index of 48.6. Associated arousal with leg movement index was 1.0.  CARDIAC DATA The underlying cardiac rhythm was most consistent with sinus rhythm. Mean heart rate during sleep was 62.6 bpm. Additional rhythm abnormalities include None.  IMPRESSIONS - Mild Obstructive Sleep apnea (OSA). Optimal pressure attained. - No Significant Central Sleep Apnea (CSA)  DIAGNOSIS - Obstructive Sleep Apnea (G47.33)  RECOMMENDATIONS - Trial of BiPAP therapy on 24/20 or 26/22 cm H2O with a Small size Resmed Full Face Mask AirFit F10 mask and heated humidification.  Marius Ditch Sleep specialist, [place] Board [employer]  ELECTRONICALLY SIGNED ON:  07/20/2021, 7:59 PM [place] PH: [phone]   FX: [phone] [place]

## 2021-08-04 ENCOUNTER — Other Ambulatory Visit: Payer: Self-pay

## 2021-08-04 ENCOUNTER — Ambulatory Visit
Admission: RE | Admit: 2021-08-04 | Discharge: 2021-08-04 | Disposition: A | Discharge status: Home / Self Care | Payer: Medicare HMO | Source: Ambulatory Visit | Attending: Internal Medicine | Admitting: Internal Medicine

## 2021-08-04 DIAGNOSIS — N6489 Other specified disorders of breast: Secondary | ICD-10-CM | POA: Diagnosis not present

## 2021-08-04 DIAGNOSIS — N6310 Unspecified lump in the right breast, unspecified quadrant: Secondary | ICD-10-CM

## 2021-08-17 DIAGNOSIS — R29898 Other symptoms and signs involving the musculoskeletal system: Secondary | ICD-10-CM | POA: Diagnosis not present

## 2021-08-18 DIAGNOSIS — H25011 Cortical age-related cataract, right eye: Secondary | ICD-10-CM | POA: Diagnosis not present

## 2021-08-18 DIAGNOSIS — H02834 Dermatochalasis of left upper eyelid: Secondary | ICD-10-CM | POA: Diagnosis not present

## 2021-08-18 DIAGNOSIS — H02831 Dermatochalasis of right upper eyelid: Secondary | ICD-10-CM | POA: Diagnosis not present

## 2021-08-18 DIAGNOSIS — H5203 Hypermetropia, bilateral: Secondary | ICD-10-CM | POA: Diagnosis not present

## 2021-08-25 DIAGNOSIS — Z4789 Encounter for other orthopedic aftercare: Secondary | ICD-10-CM | POA: Diagnosis not present

## 2021-08-25 DIAGNOSIS — R29898 Other symptoms and signs involving the musculoskeletal system: Secondary | ICD-10-CM | POA: Diagnosis not present

## 2021-11-17 IMAGING — US US BREAST*R* LIMITED INC AXILLA
1 series · 7 of 7 positions shown · non-contrast
Comparison: Previous exam(s).

CLINICAL DATA: 65-year-old female for 3 month follow-up of RIGHT
breast intraparenchymal lymph node.

EXAM:
ULTRASOUND OF THE RIGHT BREAST

[Series 1: us breast*right* limited inc axilla · 0.06mm/px · 7 of 7 slices shown]
[im 1/7]
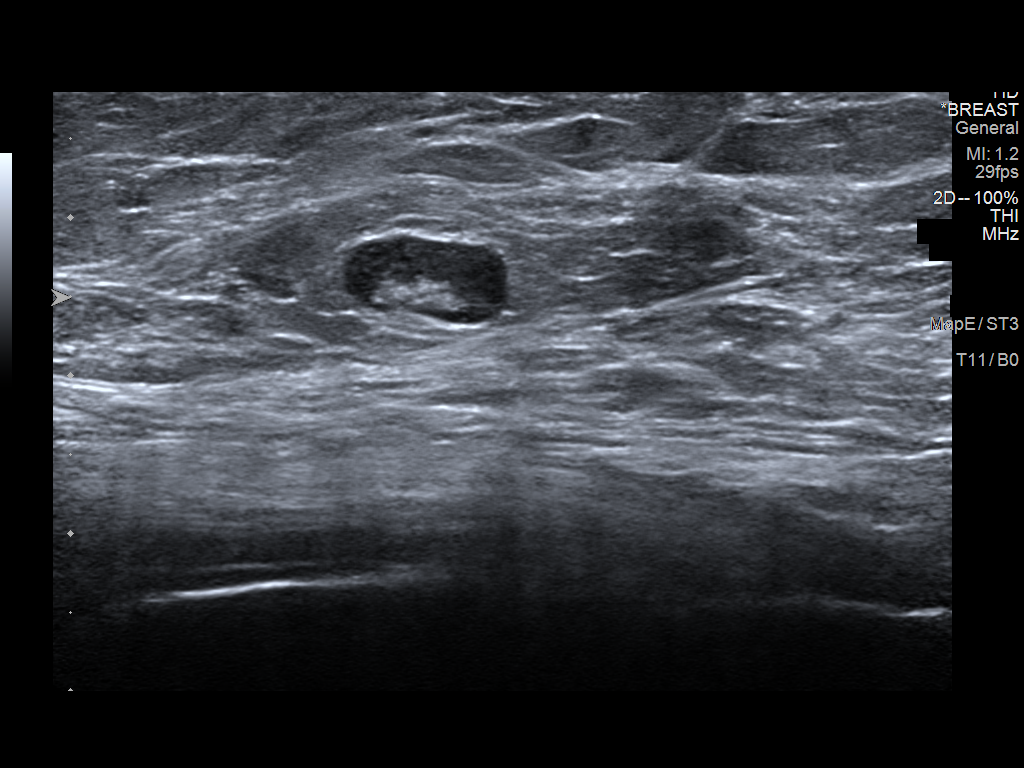
[im 2/7]
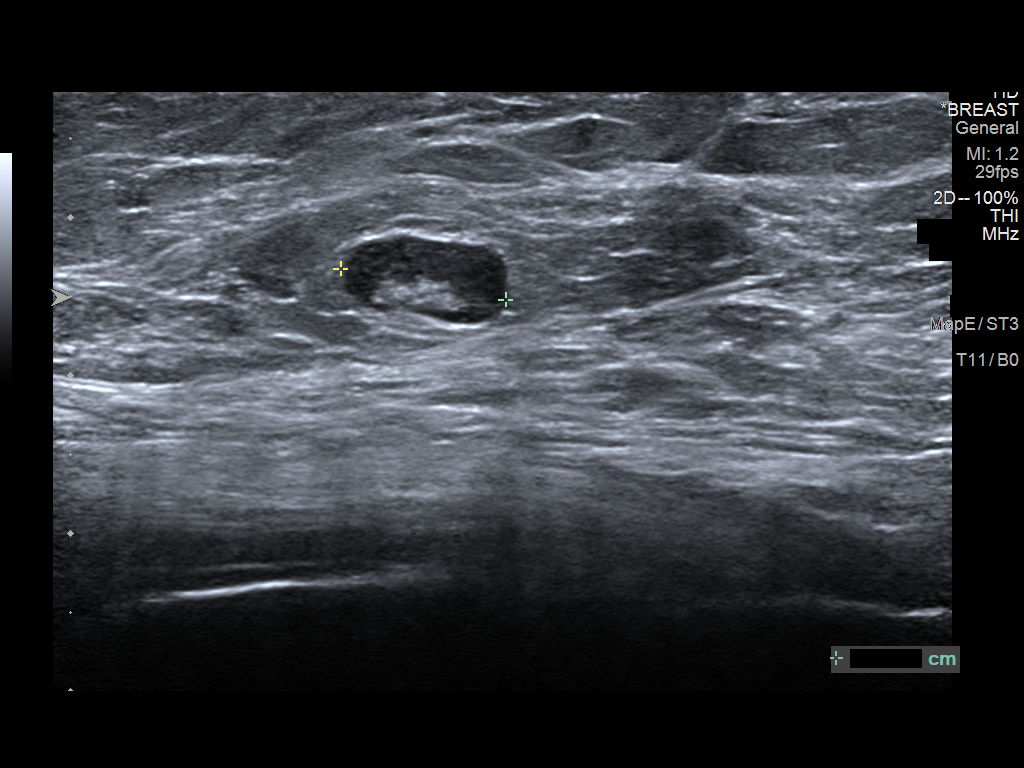
[im 3/7]
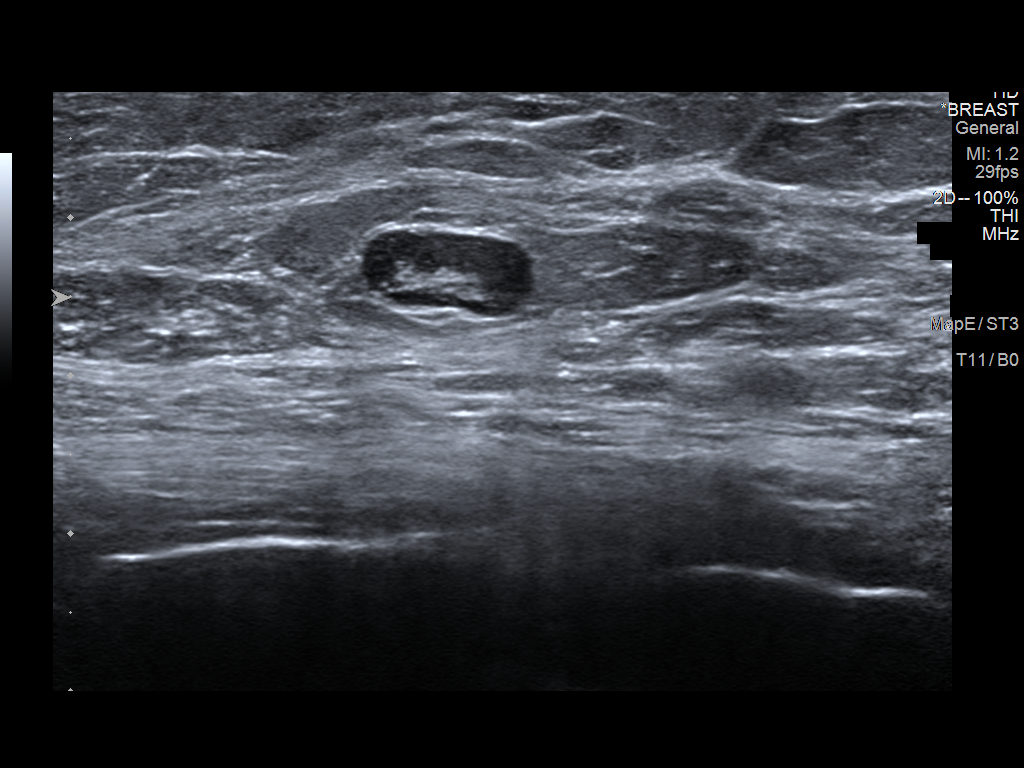
[im 4/7]
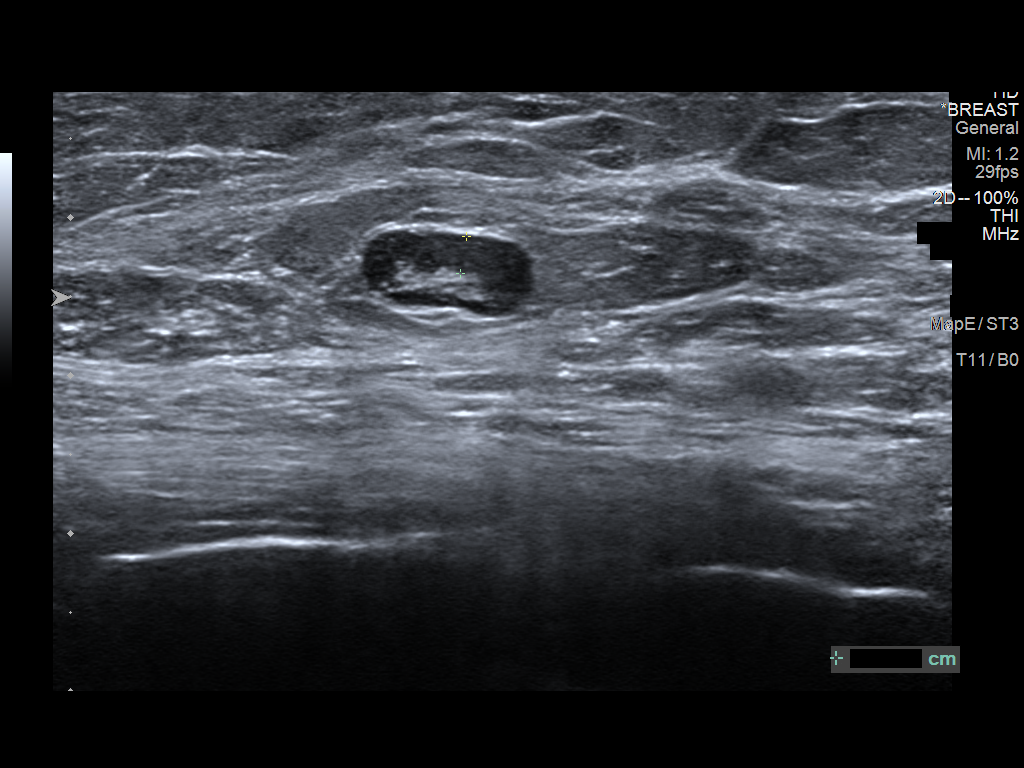
[im 5/7]
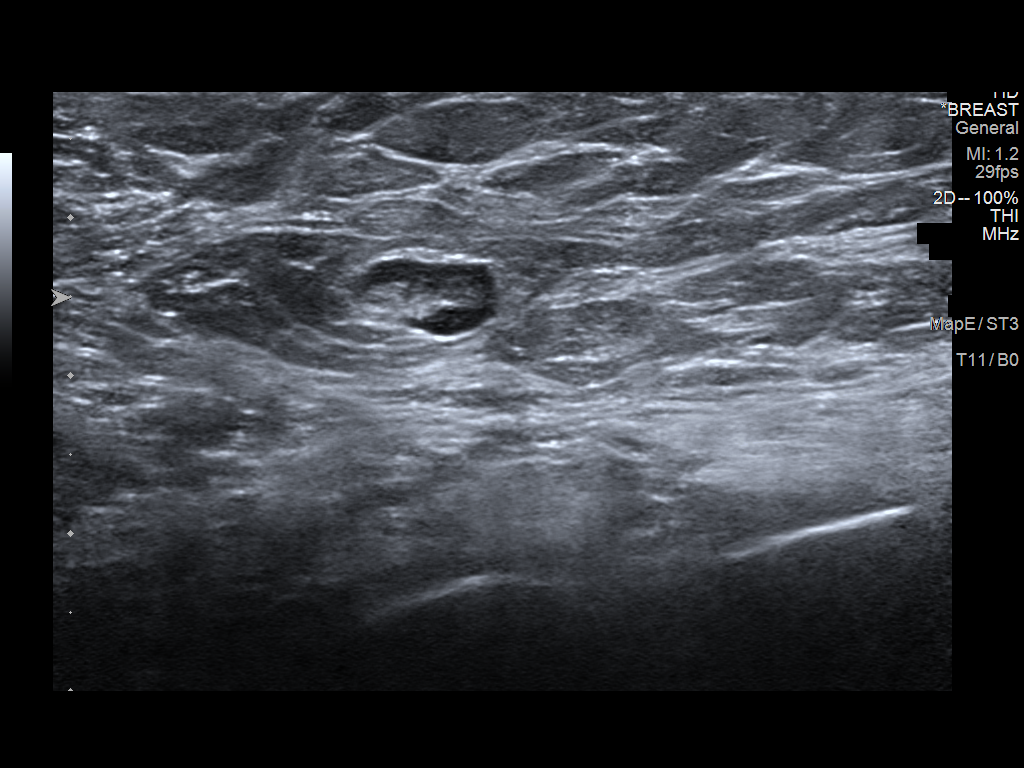
[im 6/7]
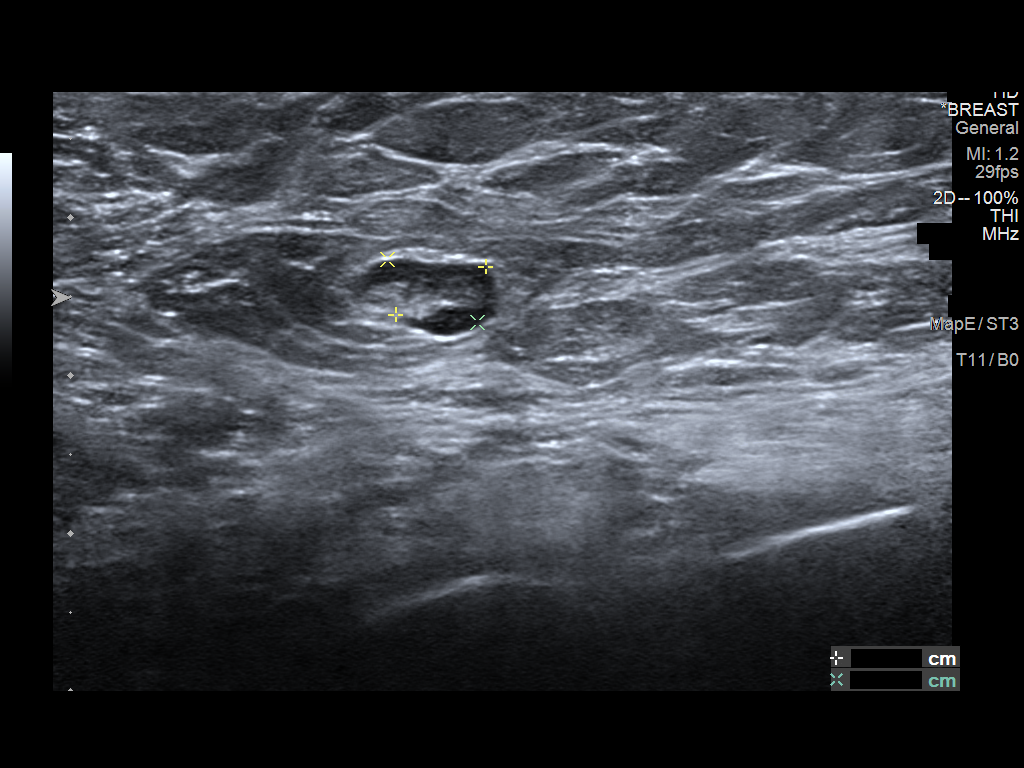
[im 7/7]
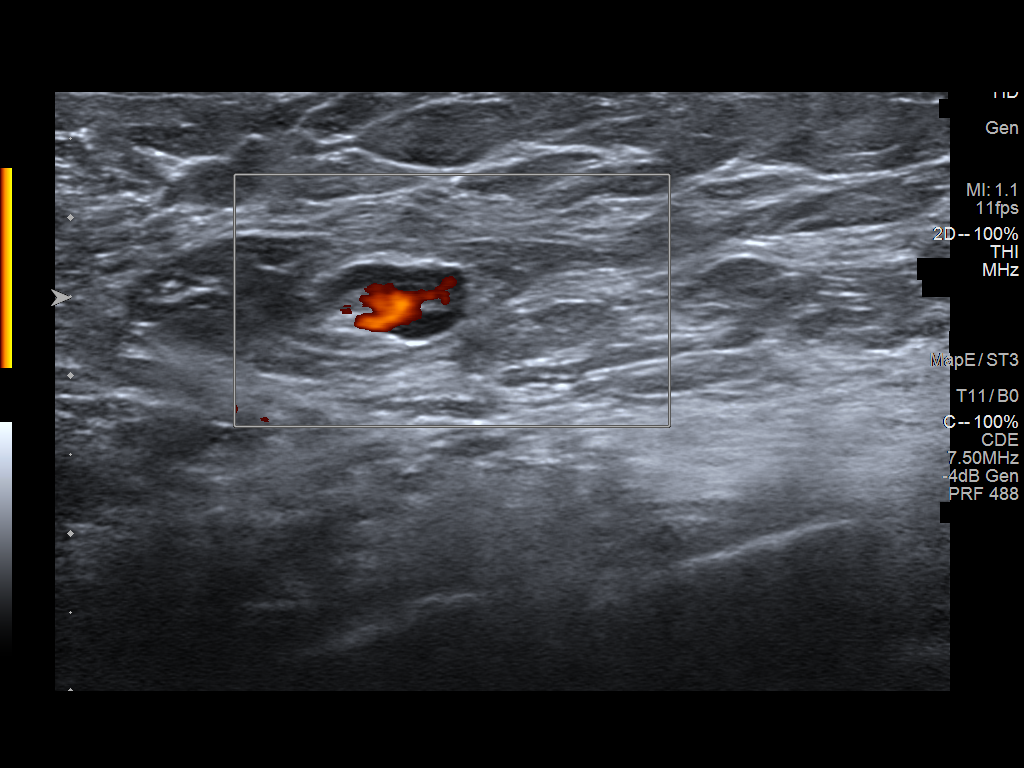

[7 of 7 positions shown; findings below may reference images not displayed]

FINDINGS: Targeted ultrasound is performed, showing an intraparenchymal lymph
node at the [DATE] position of the RIGHT breast 15 cm from the nipple,
now with cortical thickness of 2 mm, previously 3 mm, compatible
with a benign reactive node.
IMPRESSION: 1. Decrease cortical thickness of UPPER-OUTER RIGHT breast
intraparenchymal lymph node, compatible with a benign lymph node.

RECOMMENDATION:
Bilateral screening mammogram in March 2022 to resume annual
mammogram schedule.

I have discussed the findings and recommendations with the patient.
If applicable, a reminder letter will be sent to the patient
regarding the next appointment.

BI-RADS CATEGORY  2: Benign.

## 2022-04-06 DIAGNOSIS — Z0000 Encounter for general adult medical examination without abnormal findings: Secondary | ICD-10-CM | POA: Diagnosis not present

## 2022-04-06 DIAGNOSIS — Z1331 Encounter for screening for depression: Secondary | ICD-10-CM | POA: Diagnosis not present

## 2022-04-06 DIAGNOSIS — I2584 Coronary atherosclerosis due to calcified coronary lesion: Secondary | ICD-10-CM | POA: Diagnosis not present

## 2022-04-06 DIAGNOSIS — I10 Essential (primary) hypertension: Secondary | ICD-10-CM | POA: Diagnosis not present

## 2022-04-06 DIAGNOSIS — J449 Chronic obstructive pulmonary disease, unspecified: Secondary | ICD-10-CM | POA: Diagnosis not present

## 2022-04-06 DIAGNOSIS — E7800 Pure hypercholesterolemia, unspecified: Secondary | ICD-10-CM | POA: Diagnosis not present

## 2022-04-06 DIAGNOSIS — Z6841 Body Mass Index (BMI) 40.0 and over, adult: Secondary | ICD-10-CM | POA: Diagnosis not present

## 2022-04-06 DIAGNOSIS — I700 Atherosclerosis of aorta: Secondary | ICD-10-CM | POA: Diagnosis not present

## 2022-04-06 DIAGNOSIS — K219 Gastro-esophageal reflux disease without esophagitis: Secondary | ICD-10-CM | POA: Diagnosis not present

## 2022-04-06 DIAGNOSIS — G4733 Obstructive sleep apnea (adult) (pediatric): Secondary | ICD-10-CM | POA: Diagnosis not present

## 2022-04-06 DIAGNOSIS — F331 Major depressive disorder, recurrent, moderate: Secondary | ICD-10-CM | POA: Diagnosis not present

## 2022-04-06 DIAGNOSIS — Z23 Encounter for immunization: Secondary | ICD-10-CM | POA: Diagnosis not present

## 2022-05-20 DIAGNOSIS — R21 Rash and other nonspecific skin eruption: Secondary | ICD-10-CM | POA: Diagnosis not present

## 2022-05-20 DIAGNOSIS — L239 Allergic contact dermatitis, unspecified cause: Secondary | ICD-10-CM | POA: Diagnosis not present

## 2022-06-02 DIAGNOSIS — F3341 Major depressive disorder, recurrent, in partial remission: Secondary | ICD-10-CM | POA: Diagnosis not present

## 2022-08-10 ENCOUNTER — Other Ambulatory Visit: Payer: Self-pay | Admitting: Internal Medicine

## 2022-08-10 DIAGNOSIS — Z1231 Encounter for screening mammogram for malignant neoplasm of breast: Secondary | ICD-10-CM

## 2022-08-11 DIAGNOSIS — J449 Chronic obstructive pulmonary disease, unspecified: Secondary | ICD-10-CM | POA: Diagnosis not present

## 2022-08-11 DIAGNOSIS — E7800 Pure hypercholesterolemia, unspecified: Secondary | ICD-10-CM | POA: Diagnosis not present

## 2022-08-11 DIAGNOSIS — I10 Essential (primary) hypertension: Secondary | ICD-10-CM | POA: Diagnosis not present

## 2022-08-11 DIAGNOSIS — K219 Gastro-esophageal reflux disease without esophagitis: Secondary | ICD-10-CM | POA: Diagnosis not present

## 2022-08-11 DIAGNOSIS — F3341 Major depressive disorder, recurrent, in partial remission: Secondary | ICD-10-CM | POA: Diagnosis not present

## 2022-09-28 ENCOUNTER — Ambulatory Visit
Admission: RE | Admit: 2022-09-28 | Discharge: 2022-09-28 | Disposition: A | Discharge status: Home / Self Care | Payer: Medicare HMO | Source: Ambulatory Visit | Attending: Internal Medicine | Admitting: Internal Medicine

## 2022-09-28 DIAGNOSIS — Z1231 Encounter for screening mammogram for malignant neoplasm of breast: Secondary | ICD-10-CM | POA: Diagnosis not present

## 2022-11-08 DIAGNOSIS — L661 Lichen planopilaris: Secondary | ICD-10-CM | POA: Diagnosis not present

## 2022-11-23 DIAGNOSIS — H524 Presbyopia: Secondary | ICD-10-CM | POA: Diagnosis not present

## 2022-11-23 DIAGNOSIS — H25813 Combined forms of age-related cataract, bilateral: Secondary | ICD-10-CM | POA: Diagnosis not present

## 2022-11-23 DIAGNOSIS — H5203 Hypermetropia, bilateral: Secondary | ICD-10-CM | POA: Diagnosis not present

## 2022-12-20 DIAGNOSIS — N3000 Acute cystitis without hematuria: Secondary | ICD-10-CM | POA: Diagnosis not present

## 2022-12-20 DIAGNOSIS — R350 Frequency of micturition: Secondary | ICD-10-CM | POA: Diagnosis not present

## 2022-12-24 ENCOUNTER — Emergency Department: Payer: Medicare HMO

## 2022-12-24 ENCOUNTER — Other Ambulatory Visit: Payer: Self-pay

## 2022-12-24 ENCOUNTER — Inpatient Hospital Stay
Admission: EM | Admit: 2022-12-24 | Discharge: 2022-12-29 | DRG: 988 | Disposition: A | Discharge status: Home / Self Care | Payer: Medicare HMO | Attending: Student | Admitting: Student

## 2022-12-24 DIAGNOSIS — Z8249 Family history of ischemic heart disease and other diseases of the circulatory system: Secondary | ICD-10-CM | POA: Diagnosis not present

## 2022-12-24 DIAGNOSIS — Z885 Allergy status to narcotic agent status: Secondary | ICD-10-CM

## 2022-12-24 DIAGNOSIS — E785 Hyperlipidemia, unspecified: Secondary | ICD-10-CM | POA: Diagnosis present

## 2022-12-24 DIAGNOSIS — K8010 Calculus of gallbladder with chronic cholecystitis without obstruction: Secondary | ICD-10-CM | POA: Diagnosis present

## 2022-12-24 DIAGNOSIS — Z6836 Body mass index (BMI) 36.0-36.9, adult: Secondary | ICD-10-CM | POA: Diagnosis not present

## 2022-12-24 DIAGNOSIS — Z79899 Other long term (current) drug therapy: Secondary | ICD-10-CM | POA: Diagnosis not present

## 2022-12-24 DIAGNOSIS — Z87891 Personal history of nicotine dependence: Secondary | ICD-10-CM | POA: Diagnosis not present

## 2022-12-24 DIAGNOSIS — S22080A Wedge compression fracture of T11-T12 vertebra, initial encounter for closed fracture: Secondary | ICD-10-CM

## 2022-12-24 DIAGNOSIS — F32A Depression, unspecified: Secondary | ICD-10-CM

## 2022-12-24 DIAGNOSIS — R109 Unspecified abdominal pain: Secondary | ICD-10-CM

## 2022-12-24 DIAGNOSIS — Z811 Family history of alcohol abuse and dependence: Secondary | ICD-10-CM

## 2022-12-24 DIAGNOSIS — Z888 Allergy status to other drugs, medicaments and biological substances status: Secondary | ICD-10-CM | POA: Diagnosis not present

## 2022-12-24 DIAGNOSIS — K811 Chronic cholecystitis: Secondary | ICD-10-CM | POA: Diagnosis not present

## 2022-12-24 DIAGNOSIS — J449 Chronic obstructive pulmonary disease, unspecified: Secondary | ICD-10-CM | POA: Insufficient documentation

## 2022-12-24 DIAGNOSIS — K219 Gastro-esophageal reflux disease without esophagitis: Secondary | ICD-10-CM | POA: Diagnosis present

## 2022-12-24 DIAGNOSIS — Z7982 Long term (current) use of aspirin: Secondary | ICD-10-CM | POA: Diagnosis not present

## 2022-12-24 DIAGNOSIS — I10 Essential (primary) hypertension: Secondary | ICD-10-CM | POA: Diagnosis present

## 2022-12-24 DIAGNOSIS — M1990 Unspecified osteoarthritis, unspecified site: Secondary | ICD-10-CM | POA: Diagnosis present

## 2022-12-24 DIAGNOSIS — K8020 Calculus of gallbladder without cholecystitis without obstruction: Secondary | ICD-10-CM

## 2022-12-24 DIAGNOSIS — K828 Other specified diseases of gallbladder: Secondary | ICD-10-CM | POA: Diagnosis not present

## 2022-12-24 DIAGNOSIS — F39 Unspecified mood [affective] disorder: Secondary | ICD-10-CM | POA: Insufficient documentation

## 2022-12-24 DIAGNOSIS — E871 Hypo-osmolality and hyponatremia: Principal | ICD-10-CM | POA: Diagnosis present

## 2022-12-24 DIAGNOSIS — Z881 Allergy status to other antibiotic agents status: Secondary | ICD-10-CM | POA: Diagnosis not present

## 2022-12-24 DIAGNOSIS — Z8673 Personal history of transient ischemic attack (TIA), and cerebral infarction without residual deficits: Secondary | ICD-10-CM

## 2022-12-24 DIAGNOSIS — G4733 Obstructive sleep apnea (adult) (pediatric): Secondary | ICD-10-CM | POA: Diagnosis present

## 2022-12-24 DIAGNOSIS — K5730 Diverticulosis of large intestine without perforation or abscess without bleeding: Secondary | ICD-10-CM | POA: Diagnosis not present

## 2022-12-24 DIAGNOSIS — K8051 Calculus of bile duct without cholangitis or cholecystitis with obstruction: Secondary | ICD-10-CM | POA: Diagnosis not present

## 2022-12-24 LAB — URINALYSIS, ROUTINE W REFLEX MICROSCOPIC
Bilirubin Urine: NEGATIVE
Glucose, UA: NEGATIVE mg/dL
Hgb urine dipstick: NEGATIVE
Ketones, ur: NEGATIVE mg/dL
Leukocytes,Ua: NEGATIVE
Nitrite: NEGATIVE
Protein, ur: NEGATIVE mg/dL
Specific Gravity, Urine: 1.016 (ref 1.005–1.030)
pH: 6 (ref 5.0–8.0)

## 2022-12-24 LAB — CBC
HCT: 37.7 % (ref 36.0–46.0)
Hemoglobin: 13.1 g/dL (ref 12.0–15.0)
MCH: 29.2 pg (ref 26.0–34.0)
MCHC: 34.7 g/dL (ref 30.0–36.0)
MCV: 84.2 fL (ref 80.0–100.0)
Platelets: 306 10*3/uL (ref 150–400)
RBC: 4.48 MIL/uL (ref 3.87–5.11)
RDW: 12.5 % (ref 11.5–15.5)
WBC: 5.8 10*3/uL (ref 4.0–10.5)
nRBC: 0 % (ref 0.0–0.2)

## 2022-12-24 LAB — SODIUM, URINE, RANDOM: Sodium, Ur: 70 mmol/L

## 2022-12-24 LAB — BASIC METABOLIC PANEL
Anion gap: 11 (ref 5–15)
BUN: 10 mg/dL (ref 8–23)
CO2: 26 mmol/L (ref 22–32)
Calcium: 9.3 mg/dL (ref 8.9–10.3)
Chloride: 83 mmol/L — ABNORMAL LOW (ref 98–111)
Creatinine, Ser: 0.89 mg/dL (ref 0.44–1.00)
GFR, Estimated: >60 mL/min (ref >60)
Glucose, Bld: 113 mg/dL — ABNORMAL HIGH (ref 70–99)
Potassium: 3.6 mmol/L (ref 3.5–5.1)
Sodium: 120 mmol/L — ABNORMAL LOW (ref 135–145)

## 2022-12-24 LAB — HEPATIC FUNCTION PANEL
ALT: 30 U/L (ref 0–44)
AST: 30 U/L (ref 15–41)
Albumin: 4.6 g/dL (ref 3.5–5.0)
Alkaline Phosphatase: 109 U/L (ref 38–126)
Bilirubin, Direct: <0.1 mg/dL (ref 0.0–0.2)
Total Bilirubin: 0.5 mg/dL (ref 0.3–1.2)
Total Protein: 7.8 g/dL (ref 6.5–8.1)

## 2022-12-24 LAB — SODIUM
Sodium: 120 mmol/L — ABNORMAL LOW (ref 135–145)
Sodium: 122 mmol/L — ABNORMAL LOW (ref 135–145)

## 2022-12-24 LAB — CREATININE, URINE, RANDOM: Creatinine, Urine: 97 mg/dL

## 2022-12-24 LAB — LIPASE, BLOOD: Lipase: 32 U/L (ref 11–51)

## 2022-12-24 MED ORDER — BUPROPION HCL ER (XL) 300 MG PO TB24
450.0000 mg | ORAL_TABLET | Freq: Every day | ORAL | Status: DC
  Administered 2022-12-25 – 2022-12-29 (×5): 450 mg via ORAL
  Filled 2022-12-24 (×5): qty 1

## 2022-12-24 MED ORDER — UMECLIDINIUM BROMIDE 62.5 MCG/ACT IN AEPB
1.0000 | INHALATION_SPRAY | Freq: Every day | RESPIRATORY_TRACT | Status: DC
  Administered 2022-12-25 – 2022-12-29 (×4): 1 via RESPIRATORY_TRACT
  Filled 2022-12-24: qty 7

## 2022-12-24 MED ORDER — ENOXAPARIN SODIUM 40 MG/0.4ML IJ SOSY
40.0000 mg | PREFILLED_SYRINGE | INTRAMUSCULAR | Status: DC
  Administered 2022-12-24 – 2022-12-28 (×4): 40 mg via SUBCUTANEOUS
  Filled 2022-12-24 (×5): qty 0.4

## 2022-12-24 MED ORDER — DILTIAZEM HCL ER 240 MG PO CP24
240.0000 mg | ORAL_CAPSULE | Freq: Every day | ORAL | Status: DC
  Filled 2022-12-24: qty 1

## 2022-12-24 MED ORDER — ORAL CARE MOUTH RINSE: 15.0000 mL | OROMUCOSAL | Status: DC | PRN

## 2022-12-24 MED ORDER — SODIUM CHLORIDE 0.9 % IV SOLN
12.5000 mg | Freq: Four times a day (QID) | INTRAVENOUS | Status: DC | PRN
  Administered 2022-12-24: 12.5 mg via INTRAVENOUS
  Filled 2022-12-24: qty 12.5

## 2022-12-24 MED ORDER — ONDANSETRON HCL 4 MG PO TABS: 4.0000 mg | ORAL_TABLET | Freq: Four times a day (QID) | ORAL | Status: DC | PRN

## 2022-12-24 MED ORDER — CLONIDINE HCL 0.1 MG PO TABS
0.1000 mg | ORAL_TABLET | Freq: Two times a day (BID) | ORAL | Status: DC
  Administered 2022-12-24 – 2022-12-29 (×9): 0.1 mg via ORAL
  Filled 2022-12-24 (×9): qty 1

## 2022-12-24 MED ORDER — BUPROPION HCL ER (XL) 150 MG PO TB24: 300.0000 mg | ORAL_TABLET | Freq: Every day | ORAL | Status: DC

## 2022-12-24 MED ORDER — ARFORMOTEROL TARTRATE 15 MCG/2ML IN NEBU
15.0000 ug | INHALATION_SOLUTION | Freq: Two times a day (BID) | RESPIRATORY_TRACT | Status: DC
  Administered 2022-12-25 – 2022-12-28 (×5): 15 ug via RESPIRATORY_TRACT
  Filled 2022-12-24 (×9): qty 2

## 2022-12-24 MED ORDER — ASPIRIN 81 MG PO TBEC
81.0000 mg | DELAYED_RELEASE_TABLET | Freq: Every day | ORAL | Status: DC
  Administered 2022-12-25 – 2022-12-29 (×5): 81 mg via ORAL
  Filled 2022-12-24 (×5): qty 1

## 2022-12-24 MED ORDER — ATORVASTATIN CALCIUM 20 MG PO TABS
10.0000 mg | ORAL_TABLET | Freq: Every day | ORAL | Status: DC
  Administered 2022-12-24 – 2022-12-28 (×5): 10 mg via ORAL
  Filled 2022-12-24 (×5): qty 1

## 2022-12-24 MED ORDER — PAROXETINE HCL 20 MG PO TABS
40.0000 mg | ORAL_TABLET | Freq: Every day | ORAL | Status: DC
  Administered 2022-12-25 – 2022-12-29 (×5): 40 mg via ORAL
  Filled 2022-12-24 (×5): qty 2

## 2022-12-24 MED ORDER — TRAMADOL HCL 50 MG PO TABS
50.0000 mg | ORAL_TABLET | Freq: Four times a day (QID) | ORAL | Status: DC | PRN
  Administered 2022-12-25 – 2022-12-29 (×5): 50 mg via ORAL
  Filled 2022-12-24 (×6): qty 1

## 2022-12-24 MED ORDER — ONDANSETRON HCL 4 MG/2ML IJ SOLN: 4.0000 mg | Freq: Four times a day (QID) | INTRAMUSCULAR | Status: DC | PRN

## 2022-12-24 MED ORDER — FAMOTIDINE 20 MG PO TABS
20.0000 mg | ORAL_TABLET | Freq: Two times a day (BID) | ORAL | Status: DC | PRN
  Administered 2022-12-28: 20 mg via ORAL
  Filled 2022-12-24: qty 1

## 2022-12-24 MED ORDER — SODIUM CHLORIDE 0.9 % IV BOLUS
250.0000 mL | Freq: Once | INTRAVENOUS | Status: AC
  Administered 2022-12-24: 250 mL via INTRAVENOUS

## 2022-12-24 MED ORDER — DIAZEPAM 5 MG PO TABS
5.0000 mg | ORAL_TABLET | Freq: Every day | ORAL | Status: DC | PRN
  Administered 2022-12-24 – 2022-12-28 (×2): 5 mg via ORAL
  Filled 2022-12-24 (×2): qty 1

## 2022-12-24 MED ORDER — SODIUM CHLORIDE 0.9 % IV SOLN: INTRAVENOUS | Status: DC

## 2022-12-24 NOTE — ED Triage Notes (Signed)
Pt to ED for ongoing R flank pain since 1 month ago  [facility] prescribed abx for UTI on Tuesday. Pt also has N/V since Tuesday but no vomiting since 2 days ago.   No pain with urination. Nausea worse with movement. Has been taking zofran.

## 2022-12-24 NOTE — ED Provider Notes (Signed)
.     Delman Kitten, MD 12/24/22 [phone]

## 2022-12-24 NOTE — Assessment & Plan Note (Addendum)
BP 130s-160s/100s on presentation  Titrate home regimen  Hold HCTZ given hyponatremia

## 2022-12-24 NOTE — Assessment & Plan Note (Signed)
Acute on chronic hyponatremia Baseline sodium appears to be around 1 27-1 31 Sodium 120 today Clinically dry in the setting of symptomatic flank pain with decreased p.o. intake Will place patient on gentle IV fluids overnight Serial sodiums Check urine and serum osmolality Check urine sodium urine creatinine Serial sodiums overnight Avoid greater than 8-12 mill equivalent increase in sodium over the next 12 to 24 hours (goal sodium of 1 28-1 32) Follow closely

## 2022-12-24 NOTE — Assessment & Plan Note (Signed)
Recurrent flank pain x 1 month Status post course of Bactrim for UTI outpatient but persistence of symptoms Will defer continuing bactrim for now (sxs unchanged despite antibiotics-no dysuria, increased urinary frequency) Positive associated decreased p.o. intake. Labs and imaging fairly benign apart from possible cholelithiasis LFTs are stable, T. bili within normal limits. Will check HIDA scan per imaging recommendations Noted ?subacute vs. Chronic T12 fracture- no overt TTP over affected, suspect radiculopathy lower on the differential diagnosis  Pain control, antiemetics.

## 2022-12-24 NOTE — Assessment & Plan Note (Signed)
Stable from a resp standpoint  Cont home inhalers

## 2022-12-24 NOTE — Assessment & Plan Note (Addendum)
Acute on chronic hyponatremia Baseline sodium appears to be around 1 27-1 31 Noted prior regular beer intake, though pt reports no recent use over past 3-4 months.  Sodium 120 today Clinically dry in the setting of symptomatic flank pain with decreased p.o. intake Will place patient on gentle IV fluids overnight Serial sodiums Hold home HCTZ Check urine and serum osmolality Check urine sodium urine creatinine Avoid greater than 8-12 mill equivalent increase in sodium over the next 12 to 24 hours (goal sodium of 1 28-1 32) Follow closely

## 2022-12-24 NOTE — ED Provider Notes (Signed)
[facility] Provider Note    Event Date/Time   First MD Initiated Contact with Patient 12/24/22 1527     (approximate)   History   Flank Pain   HPI  Christina Walsh is a 67 y.o. female   patient with a history of sleep apnea  She is experiencing for about 1 month now pain in her right flank.  No pain no burning with urination.  Also for the last 2 weeks has had decreased oral intake nausea but no vomiting.  No diarrhea.  No headaches no chest pain no trouble breathing.  Daughter and patient both report it seems like she has had some very mild confusion or just not quite herself for about at least 2 weeks time now.  Not eating or drinking much    Currently not in any active pain except some along her right flank but she reports that went away after taking tramadol which she typically takes for chronic back pain  Physical Exam   Triage Vital Signs: ED Triage Vitals  Enc Vitals Group     BP 12/24/22 1526 (!) 138/105     Pulse Rate 12/24/22 1526 82     Resp 12/24/22 1526 18     Temp 12/24/22 1526 98.6 F (37 C)     Temp Source 12/24/22 1526 Oral     SpO2 12/24/22 1526 95 %     Weight 12/24/22 1523 185 lb (83.9 kg)     Height 12/24/22 1523 5' (1.524 m)     Head Circumference --      Peak Flow --      Pain Score 12/24/22 1523 7     Pain Loc --      Pain Edu? --      Excl. in [place]? --     Most recent vital signs: Vitals:   12/24/22 1526  BP: (!) 138/105  Pulse: 82  Resp: 18  Temp: 98.6 F (37 C)  SpO2: 95%     General: Awake, no distress.  CV:  Good peripheral perfusion.  Resp:  Normal effort.   Abd:  No distention.  Soft nontender nondistended at this time.  Negative Murphy. Other:  She has a couple areas that appear to be small pressure sores over her inner upper calves and very slight stage I.  Patient reports that for the last couple weeks she has been sitting a lot on her couch and chair.  I suspect these may be small pressure  sores   ED Results / Procedures / Treatments   Labs (all labs ordered are listed, but only abnormal results are displayed) Labs Reviewed  BASIC METABOLIC PANEL - Abnormal; Notable for the following components:      Result Value   Sodium 120 (*)    Chloride 83 (*)    Glucose, Bld 113 (*)    All other components within normal limits  CBC  HEPATIC FUNCTION PANEL  LIPASE, BLOOD  URINALYSIS, ROUTINE W REFLEX MICROSCOPIC  HIV ANTIBODY (ROUTINE TESTING W REFLEX)  CBC  CBC  COMPREHENSIVE METABOLIC PANEL  SODIUM, URINE, RANDOM  CREATININE, URINE, RANDOM  OSMOLALITY, URINE  OSMOLALITY  SODIUM  SODIUM     EKG     RADIOLOGY  CT abdomen pelvis imaging discussed with the radiologist and personally interpreted by me for acute gross pathology.  No evidence of acute gross intra-abdominal pathology.  US ABDOMEN LIMITED RUQ (LIVER/GB)  Result Date: 12/24/2022 CLINICAL DATA:  [id number] Abdominal pain  [id number] EXAM: ULTRASOUND ABDOMEN LIMITED RIGHT UPPER QUADRANT COMPARISON:  Same day CT FINDINGS: Gallbladder: Gallbladder is contracted around multiple shadowing gallstones. No pericholecystic fluid or gallbladder wall thickening. No sonographic Murphy sign noted by sonographer. Common bile duct: Diameter: Visualized portion measures 3 mm, within normal limits. Liver: No focal lesion identified. Mildly increased parenchymal echogenicity. Portal vein is patent on color Doppler imaging with normal direction of blood flow towards the liver. Other: None. IMPRESSION: 1. Cholelithiasis without sonographic evidence of acute cholecystitis. If there is concern for biliary dyskinesia, consider dedicated HIDA scan with ejection fraction calculation. 2. Mildly increased liver echogenicity which may reflect hepatic steatosis. Electronically Signed   By: Valentino Saxon M.D.   On: 12/24/2022 16:45   CT Renal Stone Study  Result Date: 12/24/2022 CLINICAL DATA:  Abdominal pain.  Right flank pain. EXAM: CT ABDOMEN  AND PELVIS WITHOUT CONTRAST TECHNIQUE: Multidetector CT imaging of the abdomen and pelvis was performed following the standard protocol without IV contrast. RADIATION DOSE REDUCTION: This exam was performed according to the departmental dose-optimization program which includes automated exposure control, adjustment of the mA and/or kV according to patient size and/or use of iterative reconstruction technique. COMPARISON:  CT abdomen pelvis dated 07/13/2009. FINDINGS: Evaluation of this exam is limited in the absence of intravenous contrast. Lower chest: The visualized lungs are clear. No intra-abdominal free air or free fluid. Hepatobiliary: The liver is unremarkable. No biliary dilatation. The gallbladder is unremarkable Pancreas: Unremarkable. No pancreatic ductal dilatation or surrounding inflammatory changes. Spleen: Normal in size without focal abnormality. Adrenals/Urinary Tract: The adrenal glands unremarkable. There is no hydronephrosis or nephrolithiasis on either side. The visualized ureters and urinary bladder appear unremarkable. Stomach/Bowel: There is no bowel obstruction or active inflammation. Several scattered sigmoid diverticula without active inflammatory changes. The appendix is not visualized with certainty. No inflammatory changes identified in the right lower quadrant. Vascular/Lymphatic: Moderate aortoiliac atherosclerotic disease. The IVC is unremarkable. No portal venous gas. There is no adenopathy. Reproductive: Hysterectomy.  No adnexal masses. Other: None Musculoskeletal: Osteopenia with multilevel degenerative changes of the spine. There is a transitional vertebra. There is compression fracture of superior endplate of 624THL with approximately 20% loss of vertebral body height, new since the prior CT, possibly acute or subacute. Correlation with clinical exam and point tenderness recommended. There is approximately 3 mm retropulsion of the superior posterior cortex. Severe degenerative  changes at L3-L4 and L4-L5. IMPRESSION: 1. No hydronephrosis or nephrolithiasis. 2. No bowel obstruction. 3. Age indeterminate, likely subacute or acute, compression fracture of superior endplate of 624THL. Correlation with clinical exam and point tenderness recommended. 4.  Aortic Atherosclerosis (ICD10-I70.0). Electronically Signed   By: Anner Crete M.D.   On: 12/24/2022 16:22       PROCEDURES:  Critical Care performed: No  Procedures   MEDICATIONS ORDERED IN ED: Medications  sodium chloride 0.9 % bolus 250 mL (has no administration in time range)  enoxaparin (LOVENOX) injection 40 mg (has no administration in time range)  0.9 %  sodium chloride infusion (has no administration in time range)  ondansetron (ZOFRAN) tablet 4 mg (has no administration in time range)    Or  ondansetron (ZOFRAN) injection 4 mg (has no administration in time range)     IMPRESSION / MDM / ASSESSMENT AND PLAN / ED COURSE  I reviewed the triage vital signs and the nursing notes.  Differential diagnosis includes, but is not limited to, pyelonephritis, cholelithiasis, cholecystitis, acute hepatitis, diverticulitis etc.  Differential diagnosis somewhat broad but appears to be suffering from right-sided flank pain.  Also experiencing some very mild confusion for the last couple of weeks.  Recently started Bactrim.  Confusion could be related to initiation of Bactrim electrolyte abnormality disorder metabolic abnormality etc.  No central neurologic features no headache no focal deficits.  No gallstones identified on imaging, but patient does not have associated right upper quadrant pain fever or leukocytosis.  Clinically I do not feel that there is a high probability of acute cholecystitis in this picture  Patient's presentation is most consistent with acute complicated illness / injury requiring diagnostic workup.   The patient is on the cardiac monitor to evaluate for evidence of  arrhythmia and/or significant heart rate changes.  Labs reviewed CBC normal.  Notable for a sodium of 120.  Given the patient's decreased oral intake I suspect this is likely from a mild element of dehydration, but further workup and testing will be required in inpatient services.  Patient and her family understand and agreeable with plan for admission.    ----------------------------------------- 4:55 PM on 12/24/2022 ----------------------------------------- Imaging denotes a potential T12 compression fracture.  Reassessed the patient and she does not have any midline thoracic or lumbar tenderness.  Her pain she reports is much more in the area of the right costovertebral angle.  Clinically it is hard to correlate her symptoms with an obvious acute compression fracture.  What is of notable concern at this point does her sodium is 120.  I have initiated small amount of IV fluid to maximum 250 normal saline.  Discussed with patient and her daughter both understand agreeable with plan for admission to the hospitalist for further care treatment and evaluation FINAL CLINICAL IMPRESSION(S) / ED DIAGNOSES   Final diagnoses:  Hyponatremia  Compression fracture of T12 vertebra, initial encounter ([place])  Calculus of gallbladder without cholecystitis without obstruction   Consulted with and patient accepted to the hospitalist service under the care of Dr. Ernestina Patches  Rx / DC Orders   ED Discharge Orders     None        Note:  This document was prepared using Dragon voice recognition software and may include unintentional dictation errors.   Delman Kitten, MD 12/24/22 [phone]

## 2022-12-24 NOTE — Assessment & Plan Note (Signed)
Appear stable  Cont paxil and wellbutrin

## 2022-12-24 NOTE — Assessment & Plan Note (Addendum)
Recurrent flank pain x 1 month Status post course of Bactrim for UTI outpatient but persistence of symptoms Positive associated decreased p.o. intake. Labs and imaging fairly benign apart from possible cholelithiasis LFTs are stable, T. bili within normal limits. Will check HIDA scan per imaging recommendations Noted ?subacute vs. Chronic tT2 fracture- no overt TTP over affected, suspect radiculopathy lower on the differential diagnosis  Pain control, antiemetics.

## 2022-12-24 NOTE — H&P (Addendum)
History and Physical    Patient: Christina Walsh [id number] DOB: February 12, 1956 DOA: 12/24/2022 DOS: the patient was seen and examined on 12/24/2022 PCP: Charlane Ferretti, MD  Patient coming from: Home  Chief Complaint:  Chief Complaint  Patient presents with   Flank Pain   HPI: Christina Walsh is a 67 y.o. female with medical history significant of COPD, GERD, hyperlipidemia, hypertension, chronic hyponatremia presenting with right flank pain and hyponatremia.  Patient reports approximately 1 month of progressive right-sided flank pain.  Patient denies any dysuria or increased urinary frequency associated with symptoms.  No hematuria.  Has had worsening decreased p.o. intake secondary to flank pain.  Does not express overt report about flank pain being worse with eating.  No fevers or chills.  No nausea or vomiting.  Was seen at urgent care within the past 2 to 3 weeks for symptoms.  Prescribed a course of Bactrim for presumed UTI with no resolution of symptoms.  Former smoker.  Patient also reports no longer drinking alcohol. Was previously drinking multiple beers, mainly once or twice a week per report. No illicit drug use.  No reported hemiparesis. Presented to the ER afebrile, hemodynamically stable.  White count 5.8, hemoglobin 13.1.  Sodium 120, creatinine 0.9.  LFTs and T. bili within normal limits.  Lipase within norm limits.  Abdominal ultrasound positive for cholelithiasis without sonographic cholecystitis.  CT stone study negative for hydronephrosis or nephrolithiasis.  No bowel obstruction. Review of Systems: As mentioned in the history of present illness. All other systems reviewed and are negative. Past Medical History:  Diagnosis Date   Achilles tendinitis    Alcoholism ([place])    Anxiety    Arthritis    COPD (chronic obstructive pulmonary disease) (HCC)    not used oxygen  at nite x 1-2 years    Depression    Dyspnea    with a lot of exertion    Family history of adverse reaction  to anesthesia    sister- -N/V    GERD (gastroesophageal reflux disease)    History of carpal tunnel surgery    Hyperlipidemia    Hypertension    OSA (obstructive sleep apnea)    mild  supposed to use cpap  not used in 1-2 years    Pneumonia    as a small child    PVC (premature ventricular contraction)    TIA (transient ischemic attack)    Past Surgical History:  Procedure Laterality Date   BREAST BIOPSY     BREAST EXCISIONAL BIOPSY Bilateral 12/13/1996   benign   CERVICAL SPINE SURGERY     CESAREAN SECTION     ORIF ANKLE FRACTURE Left 09/09/2020   Procedure: OPEN REDUCTION INTERNAL FIXATION (ORIF) ANKLE FRACTURE;  Surgeon: Earlie Server, MD;  Location: [facility];  Service: Orthopedics;  Laterality: Left;   TONSILLECTOMY     Social History:  reports that she quit smoking about 5 years ago. She has never used smokeless tobacco. She reports that she does not currently use alcohol. She reports that she does not use drugs.  Allergies  Allergen Reactions   Hydrocodone Itching and Rash   Augmentin [Amoxicillin-Pot Clavulanate] Other (See Comments)    C.Diff colitis   Celexa [Citalopram Hydrobromide] Itching   Ciprofloxacin Itching   Codeine Itching and Swelling   Coppertone Sport Spray Spf30 [Sunscreens] Itching   Felodipine Other (See Comments)    Unknown   Norvasc [Amlodipine Besylate] Swelling    Ankle swelling   Zoloft [  Sertraline Hcl] Itching and Rash    Family History  Problem Relation Age of Onset   Alcohol abuse Mother    Hypertension Father    Heart attack Father    Heart disease Father     Prior to Admission medications   Medication Sig Start Date End Date Taking? Authorizing Provider  aspirin EC 81 MG tablet Take 81 mg by mouth daily.    Yes [provider]  atorvastatin (LIPITOR) 10 MG tablet Take 10 mg by mouth daily.   Yes [provider]  buPROPion (WELLBUTRIN XL) 150 MG 24 hr tablet Take 150 mg by mouth daily. (Take with '300mg'$  tablet to  equal '450mg'$  total)   Yes [provider]  buPROPion (WELLBUTRIN XL) 300 MG 24 hr tablet Take 300 mg by mouth daily. (Take with '150mg'$  tablet to equal '450mg'$  total)   Yes [provider]  cetirizine (ZYRTEC) 10 MG tablet Take 10 mg by mouth daily as needed (itching).   Yes [provider]  cloNIDine (CATAPRES) 0.1 MG tablet Take 0.1 mg by mouth 2 (two) times daily.    Yes [provider]  diazepam (VALIUM) 5 MG tablet Take 5 mg by mouth daily as needed for muscle spasms.  08/15/16  Yes [provider]  diltiazem (DILACOR XR) 240 MG 24 hr capsule Take 240 mg by mouth daily.   Yes [provider]  diphenhydrAMINE (BENADRYL) 25 mg capsule Take 25 mg by mouth every 6 (six) hours as needed for itching.   Yes [provider]  famotidine (PEPCID) 20 MG tablet Take 20 mg by mouth 2 (two) times daily as needed for heartburn.   Yes [provider]  losartan-hydrochlorothiazide (HYZAAR) 100-12.5 MG tablet Take 1 tablet by mouth daily.    Yes [provider]  ondansetron (ZOFRAN-ODT) 4 MG disintegrating tablet Take 2 mg by mouth every 6 (six) hours as needed for nausea or vomiting.   Yes [provider]  PARoxetine (PAXIL) 40 MG tablet Take 40 mg by mouth daily.    Yes [provider]  sulfamethoxazole-trimethoprim (BACTRIM DS) 800-160 MG tablet Take 1 tablet by mouth 2 (two) times daily. 12/20/22 12/27/22 Yes [provider]  Tiotropium Bromide-Olodaterol 2.5-2.5 MCG/ACT AERS Inhale 2 puffs into the lungs daily.   Yes [provider]  traMADol (ULTRAM) 50 MG tablet Take 50 mg by mouth every 6 (six) hours as needed for moderate pain.   Yes [provider]    Physical Exam: Vitals:   12/24/22 1523 12/24/22 1526  BP:  (!) 138/105  Pulse:  82  Resp:  18  Temp:  98.6 F (37 C)  TempSrc:  Oral  SpO2:  95%  Weight: 83.9 kg   Height: 5' (1.524 m)    Physical Exam Constitutional:       General: She is not in acute distress.    Appearance: She is obese.  HENT:     Head: Normocephalic and atraumatic.     Nose: Nose normal.     Mouth/Throat:     Mouth: Mucous membranes are moist.  Eyes:     Pupils: Pupils are equal, round, and reactive to light.  Cardiovascular:     Rate and Rhythm: Normal rate and regular rhythm.  Pulmonary:     Effort: Pulmonary effort is normal.  Abdominal:     General: Bowel sounds are normal.     Comments: + mild R sided flank pain on palpation posteriorly No visible rash  +  bowel sounds    Musculoskeletal:        General: Normal range of motion.     Cervical back: Normal range of motion.  Skin:    General: Skin is warm.  Neurological:     General: No focal deficit present.  Psychiatric:        Mood and Affect: Mood normal.     Data Reviewed:  There are no new results to review at this time.  US ABDOMEN LIMITED RUQ (LIVER/GB) CLINICAL DATA:  [id number] Abdominal pain [id number]  EXAM: ULTRASOUND ABDOMEN LIMITED RIGHT UPPER QUADRANT  COMPARISON:  Same day CT  FINDINGS: Gallbladder:  Gallbladder is contracted around multiple shadowing gallstones. No pericholecystic fluid or gallbladder wall thickening. No sonographic Murphy sign noted by sonographer.  Common bile duct:  Diameter: Visualized portion measures 3 mm, within normal limits.  Liver:  No focal lesion identified. Mildly increased parenchymal echogenicity. Portal vein is patent on color Doppler imaging with normal direction of blood flow towards the liver.  Other: None.  IMPRESSION: 1. Cholelithiasis without sonographic evidence of acute cholecystitis. If there is concern for biliary dyskinesia, consider dedicated HIDA scan with ejection fraction calculation. 2. Mildly increased liver echogenicity which may reflect hepatic steatosis.  Electronically Signed   By: Valentino Saxon M.D.   On: 12/24/2022 16:45 CT Renal Stone Study CLINICAL DATA:  Abdominal pain.   Right flank pain.  EXAM: CT ABDOMEN AND PELVIS WITHOUT CONTRAST  TECHNIQUE: Multidetector CT imaging of the abdomen and pelvis was performed following the standard protocol without IV contrast.  RADIATION DOSE REDUCTION: This exam was performed according to the departmental dose-optimization program which includes automated exposure control, adjustment of the mA and/or kV according to patient size and/or use of iterative reconstruction technique.  COMPARISON:  CT abdomen pelvis dated 07/13/2009.  FINDINGS: Evaluation of this exam is limited in the absence of intravenous contrast.  Lower chest: The visualized lungs are clear.  No intra-abdominal free air or free fluid.  Hepatobiliary: The liver is unremarkable. No biliary dilatation. The gallbladder is unremarkable  Pancreas: Unremarkable. No pancreatic ductal dilatation or surrounding inflammatory changes.  Spleen: Normal in size without focal abnormality.  Adrenals/Urinary Tract: The adrenal glands unremarkable. There is no hydronephrosis or nephrolithiasis on either side. The visualized ureters and urinary bladder appear unremarkable.  Stomach/Bowel: There is no bowel obstruction or active inflammation. Several scattered sigmoid diverticula without active inflammatory changes. The appendix is not visualized with certainty. No inflammatory changes identified in the right lower quadrant.  Vascular/Lymphatic: Moderate aortoiliac atherosclerotic disease. The IVC is unremarkable. No portal venous gas. There is no adenopathy.  Reproductive: Hysterectomy.  No adnexal masses.  Other: None  Musculoskeletal: Osteopenia with multilevel degenerative changes of the spine. There is a transitional vertebra. There is compression fracture of superior endplate of 624THL with approximately 20% loss of vertebral body height, new since the prior CT, possibly acute or subacute. Correlation with clinical exam and point  tenderness recommended. There is approximately 3 mm retropulsion of the superior posterior cortex. Severe degenerative changes at L3-L4 and L4-L5.  IMPRESSION: 1. No hydronephrosis or nephrolithiasis. 2. No bowel obstruction. 3. Age indeterminate, likely subacute or acute, compression fracture of superior endplate of 624THL. Correlation with clinical exam and point tenderness recommended. 4.  Aortic Atherosclerosis (ICD10-I70.0).  Electronically Signed   By: Anner Crete M.D.   On: 12/24/2022 16:22   Lab Results  Component Value Date   WBC 5.8 12/24/2022   HGB 13.1 12/24/2022   HCT  37.7 12/24/2022   MCV 84.2 12/24/2022   PLT 306 [id number]   Last metabolic panel Lab Results  Component Value Date   GLUCOSE 113 (H) 12/24/2022   NA 120 (L) 12/24/2022   K 3.6 12/24/2022   CL 83 (L) 12/24/2022   CO2 26 12/24/2022   BUN 10 12/24/2022   CREATININE 0.89 12/24/2022   GFRNONAA >60 12/24/2022   CALCIUM 9.3 12/24/2022   PROT 7.8 12/24/2022   ALBUMIN 4.6 12/24/2022   BILITOT 0.5 12/24/2022   ALKPHOS 109 12/24/2022   AST 30 12/24/2022   ALT 30 12/24/2022   ANIONGAP 11 12/24/2022    Assessment and Plan: * Hyponatremia Acute on chronic hyponatremia Baseline sodium appears to be around 1 27-1 31 Noted prior regular beer intake, though pt reports no recent use over past 3-4 months.  Sodium 120 today Clinically dry in the setting of symptomatic flank pain with decreased p.o. intake Will place patient on gentle IV fluids overnight Serial sodiums Hold home HCTZ Check urine and serum osmolality Check urine sodium urine creatinine Avoid greater than 8-12 mill equivalent increase in sodium over the next 12 to 24 hours (goal sodium of 1 28-1 32) Follow closely  Flank pain Recurrent flank pain x 1 month Status post course of Bactrim for UTI outpatient but persistence of symptoms Will defer continuing bactrim for now (sxs unchanged despite antibiotics-no dysuria, increased urinary  frequency) Positive associated decreased p.o. intake. Labs and imaging fairly benign apart from possible cholelithiasis LFTs are stable, T. bili within normal limits. Will check HIDA scan per imaging recommendations Noted ?subacute vs. Chronic T12 fracture- no overt TTP over affected, suspect radiculopathy lower on the differential diagnosis  Pain control, antiemetics.   Mood disorder (HCC) Appear stable  Cont paxil and wellbutrin    COPD (chronic obstructive pulmonary disease) (HCC) Stable from a resp standpoint  Cont home inhalers  HTN (hypertension) BP 130s-160s/100s on presentation  Titrate home regimen  Hold HCTZ given hyponatremia         Advance Care Planning:   Code Status: Full Code   Consults: none   Family Communication: Daughter Lenox Ahr at the bedside. Overall plan of care discussed w/ patient and daughter   Severity of Illness: The appropriate patient status for this patient is OBSERVATION. Observation status is judged to be reasonable and necessary in order to provide the required intensity of service to ensure the patient's safety. The patient's presenting symptoms, physical exam findings, and initial radiographic and laboratory data in the context of their medical condition is felt to place them at decreased risk for further clinical deterioration. Furthermore, it is anticipated that the patient will be medically stable for discharge from the hospital within 2 midnights of admission.   Author: Deneise Lever, MD 12/24/2022 6:11 PM  For on call review [email or web address].

## 2022-12-25 DIAGNOSIS — Z8249 Family history of ischemic heart disease and other diseases of the circulatory system: Secondary | ICD-10-CM | POA: Diagnosis not present

## 2022-12-25 DIAGNOSIS — Z885 Allergy status to narcotic agent status: Secondary | ICD-10-CM | POA: Diagnosis not present

## 2022-12-25 DIAGNOSIS — R109 Unspecified abdominal pain: Secondary | ICD-10-CM | POA: Diagnosis present

## 2022-12-25 DIAGNOSIS — K219 Gastro-esophageal reflux disease without esophagitis: Secondary | ICD-10-CM | POA: Diagnosis present

## 2022-12-25 DIAGNOSIS — Z8673 Personal history of transient ischemic attack (TIA), and cerebral infarction without residual deficits: Secondary | ICD-10-CM | POA: Diagnosis not present

## 2022-12-25 DIAGNOSIS — J449 Chronic obstructive pulmonary disease, unspecified: Secondary | ICD-10-CM | POA: Diagnosis present

## 2022-12-25 DIAGNOSIS — K8051 Calculus of bile duct without cholangitis or cholecystitis with obstruction: Secondary | ICD-10-CM | POA: Diagnosis not present

## 2022-12-25 DIAGNOSIS — K811 Chronic cholecystitis: Secondary | ICD-10-CM | POA: Diagnosis not present

## 2022-12-25 DIAGNOSIS — K828 Other specified diseases of gallbladder: Secondary | ICD-10-CM | POA: Diagnosis not present

## 2022-12-25 DIAGNOSIS — E871 Hypo-osmolality and hyponatremia: Secondary | ICD-10-CM | POA: Diagnosis present

## 2022-12-25 DIAGNOSIS — Z888 Allergy status to other drugs, medicaments and biological substances status: Secondary | ICD-10-CM | POA: Diagnosis not present

## 2022-12-25 DIAGNOSIS — I10 Essential (primary) hypertension: Secondary | ICD-10-CM | POA: Diagnosis present

## 2022-12-25 DIAGNOSIS — Z811 Family history of alcohol abuse and dependence: Secondary | ICD-10-CM | POA: Diagnosis not present

## 2022-12-25 DIAGNOSIS — M1990 Unspecified osteoarthritis, unspecified site: Secondary | ICD-10-CM | POA: Diagnosis present

## 2022-12-25 DIAGNOSIS — Z881 Allergy status to other antibiotic agents status: Secondary | ICD-10-CM | POA: Diagnosis not present

## 2022-12-25 DIAGNOSIS — Z6836 Body mass index (BMI) 36.0-36.9, adult: Secondary | ICD-10-CM | POA: Diagnosis not present

## 2022-12-25 DIAGNOSIS — F39 Unspecified mood [affective] disorder: Secondary | ICD-10-CM | POA: Diagnosis present

## 2022-12-25 DIAGNOSIS — E785 Hyperlipidemia, unspecified: Secondary | ICD-10-CM | POA: Diagnosis present

## 2022-12-25 DIAGNOSIS — G4733 Obstructive sleep apnea (adult) (pediatric): Secondary | ICD-10-CM | POA: Diagnosis present

## 2022-12-25 DIAGNOSIS — Z87891 Personal history of nicotine dependence: Secondary | ICD-10-CM | POA: Diagnosis not present

## 2022-12-25 DIAGNOSIS — K8010 Calculus of gallbladder with chronic cholecystitis without obstruction: Secondary | ICD-10-CM | POA: Diagnosis present

## 2022-12-25 DIAGNOSIS — Z79899 Other long term (current) drug therapy: Secondary | ICD-10-CM | POA: Diagnosis not present

## 2022-12-25 DIAGNOSIS — Z7982 Long term (current) use of aspirin: Secondary | ICD-10-CM | POA: Diagnosis not present

## 2022-12-25 LAB — BRAIN NATRIURETIC PEPTIDE: B Natriuretic Peptide: 41 pg/mL (ref 0.0–100.0)

## 2022-12-25 LAB — CBC
HCT: 34.6 % — ABNORMAL LOW (ref 36.0–46.0)
Hemoglobin: 12 g/dL (ref 12.0–15.0)
MCH: 29.2 pg (ref 26.0–34.0)
MCHC: 34.7 g/dL (ref 30.0–36.0)
MCV: 84.2 fL (ref 80.0–100.0)
Platelets: 282 10*3/uL (ref 150–400)
RBC: 4.11 MIL/uL (ref 3.87–5.11)
RDW: 12.4 % (ref 11.5–15.5)
WBC: 5.8 10*3/uL (ref 4.0–10.5)
nRBC: 0 % (ref 0.0–0.2)

## 2022-12-25 LAB — MAGNESIUM: Magnesium: 2.1 mg/dL (ref 1.7–2.4)

## 2022-12-25 LAB — COMPREHENSIVE METABOLIC PANEL
ALT: 25 U/L (ref 0–44)
AST: 22 U/L (ref 15–41)
Albumin: 3.8 g/dL (ref 3.5–5.0)
Alkaline Phosphatase: 93 U/L (ref 38–126)
Anion gap: 6 (ref 5–15)
BUN: 7 mg/dL — ABNORMAL LOW (ref 8–23)
CO2: 31 mmol/L (ref 22–32)
Calcium: 8.9 mg/dL (ref 8.9–10.3)
Chloride: 89 mmol/L — ABNORMAL LOW (ref 98–111)
Creatinine, Ser: 0.77 mg/dL (ref 0.44–1.00)
GFR, Estimated: >60 mL/min (ref >60)
Glucose, Bld: 93 mg/dL (ref 70–99)
Potassium: 3.6 mmol/L (ref 3.5–5.1)
Sodium: 126 mmol/L — ABNORMAL LOW (ref 135–145)
Total Bilirubin: 0.5 mg/dL (ref 0.3–1.2)
Total Protein: 6.4 g/dL — ABNORMAL LOW (ref 6.5–8.1)

## 2022-12-25 LAB — OSMOLALITY, URINE: Osmolality, Ur: 429 mOsm/kg (ref 300–900)

## 2022-12-25 LAB — SODIUM: Sodium: 127 mmol/L — ABNORMAL LOW (ref 135–145)

## 2022-12-25 LAB — HIV ANTIBODY (ROUTINE TESTING W REFLEX): HIV Screen 4th Generation wRfx: NONREACTIVE

## 2022-12-25 LAB — PHOSPHORUS: Phosphorus: 3.2 mg/dL (ref 2.5–4.6)

## 2022-12-25 LAB — OSMOLALITY: Osmolality: 254 mOsm/kg — ABNORMAL LOW (ref 275–295)

## 2022-12-25 MED ORDER — SODIUM CHLORIDE 1 G PO TABS
1.0000 g | ORAL_TABLET | Freq: Three times a day (TID) | ORAL | Status: DC
  Administered 2022-12-25 – 2022-12-26 (×5): 1 g via ORAL
  Filled 2022-12-25 (×5): qty 1

## 2022-12-25 MED ORDER — SODIUM CHLORIDE 0.9 % IV SOLN: INTRAVENOUS | Status: DC

## 2022-12-25 MED ORDER — DILTIAZEM HCL ER COATED BEADS 240 MG PO CP24
240.0000 mg | ORAL_CAPSULE | Freq: Every day | ORAL | Status: DC
  Administered 2022-12-25 – 2022-12-29 (×5): 240 mg via ORAL
  Filled 2022-12-25 (×5): qty 1

## 2022-12-25 NOTE — Progress Notes (Signed)
[facility] Progress Note  Patient: Christina Walsh    [id number]  DOA: 12/24/2022     Date of Service: the patient was seen and examined on 12/25/2022  Chief Complaint  Patient presents with   Flank Pain   Brief hospital course: Christina Walsh is a 67 y.o. female with medical history significant of COPD, GERD, hyperlipidemia, hypertension, chronic hyponatremia presenting with right flank pain and hyponatremia.  Patient reports approximately 1 month of progressive right-sided flank pain.  Patient is a poor historian, stated that she has a decreased appetite but unable to tell me any relation ship of the pain with food intake. Was seen at urgent care within the past 2 to 3 weeks for symptoms. Prescribed a course of Bactrim for presumed UTI with no resolution of symptoms.  ED workup: Sodium 120, hyponatremia, LFTs within normal range CT A/P positive for cholelithiasis, no any other significant findings. [facility] hospitalist was consulted for admission and further management as below.   Assessment and Plan:  Hypotonic hyponatremia could be secondary to polydipsia versus SIADH, it could be due to Wellbutrin and Paxil Held hydrochlorothiazide for now Serum osmolality 254, very low Na 120 ---127 gradually improving Started sodium chloride tablets p.o. 3 times daily Monitor sodium level daily Continue gentle hydration NS 50 ml/hr x 1 day  Right flank pain, unknown cause could be secondary to cholelithiasis CTAP reviewed Follow HIDA scan  Mood disorder (HCC) Appear stable  Cont paxil and wellbutrin      COPD (chronic obstructive pulmonary disease) (HCC) Stable from a resp standpoint  Cont home inhalers   HTN (hypertension), HLD BP 130s-160s/100s on presentation  Hold HCTZ given hyponatremia  Continue clonidine and Cardizem CD home dose Continued Lipitor 10 mg home dose  Body mass index is 36.13 kg/m.  Interventions:       Diet: Heart healthy diet, fluid restriction 1.5  L/day advised DVT Prophylaxis: Subcutaneous Lovenox   Advance goals of care discussion: Full code  Family Communication: family was present at bedside, at the time of interview.  The pt provided permission to discuss medical plan with the family. Opportunity was given to ask question and all questions were answered satisfactorily.   Disposition:  Pt is from Home, admitted with hyponatremia and right flank pain, still has low sodium and HIDA scan is pending, which precludes a safe discharge. Discharge to home, when clinically stable, may need few days to improve.  Subjective: No significant events overnight, patient is having vague pain in the right flank and lower back pain.  Denies any chest pain or palpitation or any shortness of breath.  Denies any other active issues.   Physical Exam: General: NAD, lying comfortably Appear in no distress, affect appropriate Eyes: PERRLA ENT: Oral Mucosa Clear, moist  Neck: no JVD,  Cardiovascular: S1 and S2 Present, no Murmur,  Respiratory: good respiratory effort, Bilateral Air entry equal and Decreased, no Crackles, no wheezes Abdomen: Bowel Sound present, Soft and no tenderness,  Skin: no rashes Extremities: no Pedal edema, no calf tenderness Neurologic: without any new focal findings Gait not checked due to patient safety concerns  Vitals:   12/24/22 2209 12/25/22 0603 12/25/22 0828 12/25/22 0912  BP: 133/84 (!) 142/82  134/65  Pulse: 72 68  86  Resp: '18 16  18  '$ Temp: 97.8 F (36.6 C) 98.1 F (36.7 C)  98.1 F (36.7 C)  TempSrc:      SpO2: 99% 100% 97% 94%  Weight:  Height:        Intake/Output Summary (Last 24 hours) at 12/25/2022 1223 Last data filed at 12/25/2022 0600 Gross per 24 hour  Intake 1660 ml  Output 850 ml  Net 810 ml   Filed Weights   12/24/22 1523  Weight: 83.9 kg    Data Reviewed: I have personally reviewed and interpreted daily labs, tele strips, imagings as discussed above. I reviewed all nursing  notes, pharmacy notes, vitals, pertinent old records I have discussed plan of care as described above with RN and patient/family.  CBC: Recent Labs  Lab 12/24/22 1526 12/25/22 0418  WBC 5.8 5.8  HGB 13.1 12.0  HCT 37.7 34.6*  MCV 84.2 84.2  PLT 306 [id number]   Basic Metabolic Panel: Recent Labs  Lab 12/24/22 1526 12/24/22 1902 12/24/22 2300 12/25/22 0415 12/25/22 0418 12/25/22 1115  NA 120* 120* 122*  --  126* 127*  K 3.6  --   --   --  3.6  --   CL 83*  --   --   --  89*  --   CO2 26  --   --   --  31  --   GLUCOSE 113*  --   --   --  93  --   BUN 10  --   --   --  7*  --   CREATININE 0.89  --   --   --  0.77  --   CALCIUM 9.3  --   --   --  8.9  --   MG  --   --   --  2.1  --   --   PHOS  --   --   --  3.2  --   --     Studies: US ABDOMEN LIMITED RUQ (LIVER/GB)  Result Date: 12/24/2022 CLINICAL DATA:  [id number] Abdominal pain [id number] EXAM: ULTRASOUND ABDOMEN LIMITED RIGHT UPPER QUADRANT COMPARISON:  Same day CT FINDINGS: Gallbladder: Gallbladder is contracted around multiple shadowing gallstones. No pericholecystic fluid or gallbladder wall thickening. No sonographic Murphy sign noted by sonographer. Common bile duct: Diameter: Visualized portion measures 3 mm, within normal limits. Liver: No focal lesion identified. Mildly increased parenchymal echogenicity. Portal vein is patent on color Doppler imaging with normal direction of blood flow towards the liver. Other: None. IMPRESSION: 1. Cholelithiasis without sonographic evidence of acute cholecystitis. If there is concern for biliary dyskinesia, consider dedicated HIDA scan with ejection fraction calculation. 2. Mildly increased liver echogenicity which may reflect hepatic steatosis. Electronically Signed   By: Valentino Saxon M.D.   On: 12/24/2022 16:45   CT Renal Stone Study  Result Date: 12/24/2022 CLINICAL DATA:  Abdominal pain.  Right flank pain. EXAM: CT ABDOMEN AND PELVIS WITHOUT CONTRAST TECHNIQUE: Multidetector CT imaging  of the abdomen and pelvis was performed following the standard protocol without IV contrast. RADIATION DOSE REDUCTION: This exam was performed according to the departmental dose-optimization program which includes automated exposure control, adjustment of the mA and/or kV according to patient size and/or use of iterative reconstruction technique. COMPARISON:  CT abdomen pelvis dated 07/13/2009. FINDINGS: Evaluation of this exam is limited in the absence of intravenous contrast. Lower chest: The visualized lungs are clear. No intra-abdominal free air or free fluid. Hepatobiliary: The liver is unremarkable. No biliary dilatation. The gallbladder is unremarkable Pancreas: Unremarkable. No pancreatic ductal dilatation or surrounding inflammatory changes. Spleen: Normal in size without focal abnormality. Adrenals/Urinary Tract: The adrenal glands unremarkable. There is no hydronephrosis or nephrolithiasis  on either side. The visualized ureters and urinary bladder appear unremarkable. Stomach/Bowel: There is no bowel obstruction or active inflammation. Several scattered sigmoid diverticula without active inflammatory changes. The appendix is not visualized with certainty. No inflammatory changes identified in the right lower quadrant. Vascular/Lymphatic: Moderate aortoiliac atherosclerotic disease. The IVC is unremarkable. No portal venous gas. There is no adenopathy. Reproductive: Hysterectomy.  No adnexal masses. Other: None Musculoskeletal: Osteopenia with multilevel degenerative changes of the spine. There is a transitional vertebra. There is compression fracture of superior endplate of 624THL with approximately 20% loss of vertebral body height, new since the prior CT, possibly acute or subacute. Correlation with clinical exam and point tenderness recommended. There is approximately 3 mm retropulsion of the superior posterior cortex. Severe degenerative changes at L3-L4 and L4-L5. IMPRESSION: 1. No hydronephrosis or  nephrolithiasis. 2. No bowel obstruction. 3. Age indeterminate, likely subacute or acute, compression fracture of superior endplate of 624THL. Correlation with clinical exam and point tenderness recommended. 4.  Aortic Atherosclerosis (ICD10-I70.0). Electronically Signed   By: Anner Crete M.D.   On: 12/24/2022 16:22    Scheduled Meds:  arformoterol  15 mcg Nebulization BID   And   umeclidinium bromide  1 puff Inhalation Daily   aspirin EC  81 mg Oral Daily   atorvastatin  10 mg Oral QHS   buPROPion  450 mg Oral Daily   cloNIDine  0.1 mg Oral BID   diltiazem  240 mg Oral Daily   enoxaparin (LOVENOX) injection  40 mg Subcutaneous Q24H   PARoxetine  40 mg Oral Daily   sodium chloride  1 g Oral TID WC   Continuous Infusions:  sodium chloride Stopped (12/25/22 0812)   promethazine (PHENERGAN) injection (IM or IVPB) 12.5 mg (12/24/22 1934)   PRN Meds: diazepam, famotidine, ondansetron **OR** ondansetron (ZOFRAN) IV, mouth rinse, promethazine (PHENERGAN) injection (IM or IVPB), traMADol  Time spent: 35 minutes  Author: Val Riles. MD [facility] 12/25/2022 12:23 PM  To reach On-call, see care teams to locate the attending and reach out to them via [email or web address]. If 7PM-7AM, please contact night-coverage If you still have difficulty reaching the attending provider, please page the [facility] (Director on Call) for [facility] on amion for assistance.

## 2022-12-26 ENCOUNTER — Inpatient Hospital Stay: Payer: Medicare HMO

## 2022-12-26 DIAGNOSIS — E871 Hypo-osmolality and hyponatremia: Secondary | ICD-10-CM | POA: Diagnosis not present

## 2022-12-26 LAB — BASIC METABOLIC PANEL
Anion gap: 8 (ref 5–15)
BUN: 12 mg/dL (ref 8–23)
CO2: 28 mmol/L (ref 22–32)
Calcium: 9.1 mg/dL (ref 8.9–10.3)
Chloride: 97 mmol/L — ABNORMAL LOW (ref 98–111)
Creatinine, Ser: 0.79 mg/dL (ref 0.44–1.00)
GFR, Estimated: >60 mL/min (ref >60)
Glucose, Bld: 101 mg/dL — ABNORMAL HIGH (ref 70–99)
Potassium: 4.1 mmol/L (ref 3.5–5.1)
Sodium: 133 mmol/L — ABNORMAL LOW (ref 135–145)

## 2022-12-26 LAB — CBC
HCT: 34.7 % — ABNORMAL LOW (ref 36.0–46.0)
Hemoglobin: 11.7 g/dL — ABNORMAL LOW (ref 12.0–15.0)
MCH: 29.5 pg (ref 26.0–34.0)
MCHC: 33.7 g/dL (ref 30.0–36.0)
MCV: 87.4 fL (ref 80.0–100.0)
Platelets: 251 10*3/uL (ref 150–400)
RBC: 3.97 MIL/uL (ref 3.87–5.11)
RDW: 12.6 % (ref 11.5–15.5)
WBC: 5.4 10*3/uL (ref 4.0–10.5)
nRBC: 0 % (ref 0.0–0.2)

## 2022-12-26 LAB — MAGNESIUM: Magnesium: 2.1 mg/dL (ref 1.7–2.4)

## 2022-12-26 LAB — PHOSPHORUS: Phosphorus: 3.6 mg/dL (ref 2.5–4.6)

## 2022-12-26 MED ORDER — TECHNETIUM TC 99M MEBROFENIN IV KIT
5.2400 | PACK | Freq: Once | INTRAVENOUS | Status: AC | PRN
  Administered 2022-12-26: 5.24 via INTRAVENOUS

## 2022-12-26 NOTE — Progress Notes (Signed)
[facility] Progress Note  Patient: Christina Walsh    [id number]  DOA: 12/24/2022     Date of Service: the patient was seen and examined on 12/26/2022  Chief Complaint  Patient presents with   Flank Pain   Brief hospital course: Christina Walsh is a 67 y.o. female with medical history significant of COPD, GERD, hyperlipidemia, hypertension, chronic hyponatremia presenting with right flank pain and hyponatremia.  Patient reports approximately 1 month of progressive right-sided flank pain.  Patient is a poor historian, stated that she has a decreased appetite but unable to tell me any relation ship of the pain with food intake. Was seen at urgent care within the past 2 to 3 weeks for symptoms. Prescribed a course of Bactrim for presumed UTI with no resolution of symptoms.  ED workup: Sodium 120, hyponatremia, LFTs within normal range CT A/P positive for cholelithiasis, no any other significant findings. [facility] hospitalist was consulted for admission and further management as below.   Assessment and Plan:  Hypotonic hyponatremia could be secondary to polydipsia versus SIADH, it could be due to Wellbutrin and Paxil Held hydrochlorothiazide for now Serum osmolality 254, very low Na 120 ---127--133 gradually improving Started sodium chloride tablets p.o. 3 times daily Monitor sodium level daily S/p gentle hydration NS 50 ml/hr x 1 day   Right flank pain, unknown cause could be secondary to cholelithiasis CTAP reviewed HIDA scan GB EF 3%, no any other acute findings General surgery consulted, recommended lap chole versus observation.  Patient's complaints are very vague and nonspecific with gallbladder.  After lap chole pain may not resolve.  Patient needs to talk to her family and then make a decision.   Mood disorder (HCC) Appear stable  Cont paxil and wellbutrin      COPD (chronic obstructive pulmonary disease) (HCC) Stable from a resp standpoint  Cont home inhalers   HTN  (hypertension), HLD BP 130s-160s/100s on presentation  Hold HCTZ given hyponatremia  Continue clonidine and Cardizem CD home dose Continued Lipitor 10 mg home dose  Body mass index is 36.13 kg/m.  Interventions:       Diet: Heart healthy diet, fluid restriction 1.5 L/day advised DVT Prophylaxis: Subcutaneous Lovenox   Advance goals of care discussion: Full code  Family Communication: family was present at bedside, at the time of interview.  The pt provided permission to discuss medical plan with the family. Opportunity was given to ask question and all questions were answered satisfactorily.   Disposition:  Pt is from Home, admitted with hyponatremia and right flank pain, still has low sodium and HIDA scan is pending, which precludes a safe discharge. Discharge to home, when clinically stable, may need few days to improve.  Subjective: No significant events overnight, patient was seen after HIDA scan, still patient is having right flank area pain, no right upper quadrant pain.  Patient did take protein supplement and which caused abdominal discomfort which lasted for some time and then resolved.  Denies any nausea vomiting, no diarrhea, no chest pain or palpitation, no shortness of breath. Patient is to think about lap chole and will discuss with her daughter and then make a final decision today.    Physical Exam: General: NAD, lying comfortably Appear in no distress, affect appropriate Eyes: PERRLA ENT: Oral Mucosa Clear, moist  Neck: no JVD,  Cardiovascular: S1 and S2 Present, no Murmur,  Respiratory: good respiratory effort, Bilateral Air entry equal and Decreased, no Crackles, no wheezes Abdomen: Bowel Sound present, Soft  and no tenderness,  Skin: no rashes Extremities: no Pedal edema, no calf tenderness Neurologic: without any new focal findings Gait not checked due to patient safety concerns  Vitals:   12/25/22 0912 12/25/22 2036 12/25/22 2230 12/26/22 0619  BP:  134/65  117/80 115/67  Pulse: 86  64 65  Resp: '18  18 18  '$ Temp: 98.1 F (36.7 C)  98.2 F (36.8 C) 97.8 F (36.6 C)  TempSrc:      SpO2: 94% 99% 98% 95%  Weight:      Height:       No intake or output data in the 24 hours ending 12/26/22 1316  Filed Weights   12/24/22 1523  Weight: 83.9 kg    Data Reviewed: I have personally reviewed and interpreted daily labs, tele strips, imagings as discussed above. I reviewed all nursing notes, pharmacy notes, vitals, pertinent old records I have discussed plan of care as described above with RN and patient/family.  CBC: Recent Labs  Lab 12/24/22 1526 12/25/22 0418 12/26/22 0417  WBC 5.8 5.8 5.4  HGB 13.1 12.0 11.7*  HCT 37.7 34.6* 34.7*  MCV 84.2 84.2 87.4  PLT 306 282 [id number]   Basic Metabolic Panel: Recent Labs  Lab 12/24/22 1526 12/24/22 1902 12/24/22 2300 12/25/22 0415 12/25/22 0418 12/25/22 1115 12/26/22 0417  NA 120* 120* 122*  --  126* 127* 133*  K 3.6  --   --   --  3.6  --  4.1  CL 83*  --   --   --  89*  --  97*  CO2 26  --   --   --  31  --  28  GLUCOSE 113*  --   --   --  93  --  101*  BUN 10  --   --   --  7*  --  12  CREATININE 0.89  --   --   --  0.77  --  0.79  CALCIUM 9.3  --   --   --  8.9  --  9.1  MG  --   --   --  2.1  --   --  2.1  PHOS  --   --   --  3.2  --   --  3.6    Studies: NM Hepato W/EF  Result Date: 12/26/2022 CLINICAL DATA:  Biliary colic, recurrent, gallbladder dyskinesia suspected. EXAM: NUCLEAR MEDICINE HEPATOBILIARY IMAGING WITH GALLBLADDER EF TECHNIQUE: Sequential images of the abdomen were obtained out to 60 minutes following intravenous administration of radiopharmaceutical. After oral ingestion of Ensure, gallbladder ejection fraction was determined. At 60 min, normal ejection fraction is greater than 33%. RADIOPHARMACEUTICALS:  5.24 mCi Tc-60m Choletec IV [place]PARISON:  [place]. FINDINGS: Prompt uptake and biliary excretion of activity by the liver is seen. Gallbladder  activity is visualized, consistent with patency of cystic duct. Biliary activity passes into small bowel, consistent with patent common bile duct. Calculated gallbladder ejection fraction is 2%. (Normal gallbladder ejection fraction with Ensure is greater than 33%.) IMPRESSION: Reduced gallbladder ejection fraction as can be seen with chronic cholecystitis/biliary dyskinesia. Electronically Signed   By: JDahlia BailiffM.D.   On: 12/26/2022 11:31    Scheduled Meds:  arformoterol  15 mcg Nebulization BID   And   umeclidinium bromide  1 puff Inhalation Daily   aspirin EC  81 mg Oral Daily   atorvastatin  10 mg Oral QHS   buPROPion  450 mg Oral  Daily   cloNIDine  0.1 mg Oral BID   diltiazem  240 mg Oral Daily   enoxaparin (LOVENOX) injection  40 mg Subcutaneous Q24H   PARoxetine  40 mg Oral Daily   sodium chloride  1 g Oral TID WC   Continuous Infusions:  sodium chloride 50 mL/hr at 12/26/22 1121   promethazine (PHENERGAN) injection (IM or IVPB) 12.5 mg (12/24/22 1934)   PRN Meds: diazepam, famotidine, ondansetron **OR** ondansetron (ZOFRAN) IV, mouth rinse, promethazine (PHENERGAN) injection (IM or IVPB), traMADol  Time spent: 35 minutes  Author: Val Riles. MD [facility] 12/26/2022 1:16 PM  To reach On-call, see care teams to locate the attending and reach out to them via [email or web address]. If 7PM-7AM, please contact night-coverage If you still have difficulty reaching the attending provider, please page the [facility] (Director on Call) for [facility] on amion for assistance.

## 2022-12-26 NOTE — Progress Notes (Signed)
Patient returned to floor in bed in stable condition.

## 2022-12-26 NOTE — Progress Notes (Signed)
Patient transported for hiatal scan in bed with transport in stable condition.

## 2022-12-26 NOTE — Consult Note (Signed)
SURGICAL CONSULTATION NOTE   HISTORY OF PRESENT ILLNESS (HPI):  67 y.o. female presented to [facility] ED for evaluation of dizziness and right flank pain. Patient reports she has been feeling dizziness and right flank pain for the last few days.  Right flank pain radiates to the right upper back.  She cannot identify any alleviating or aggravating factors.  Due to the concern of fever and dizziness she decided to come to the emergency room.  At the ED she was found with hyponatremia.  The was treated and now resolved.  Due to the flank pain she had a CT scan renal study for evaluation of possible kidney stones but she was found with gallstones.  Abdominal ultrasound showed gallstones.  She also had a HIDA scan that shows an ejection fraction of 2%.  There was concerning for chronic cholecystitis.  Surgery is consulted by Dr. Dwyane Dee in this context for evaluation and management of biliary dyskinesia or chronic cholecystitis.  PAST MEDICAL HISTORY (PMH):  Past Medical History:  Diagnosis Date   Achilles tendinitis    Alcoholism ([place])    Anxiety    Arthritis    COPD (chronic obstructive pulmonary disease) (HCC)    not used oxygen  at nite x 1-2 years    Depression    Dyspnea    with a lot of exertion    Family history of adverse reaction to anesthesia    sister- -N/V    GERD (gastroesophageal reflux disease)    History of carpal tunnel surgery    Hyperlipidemia    Hypertension    OSA (obstructive sleep apnea)    mild  supposed to use cpap  not used in 1-2 years    Pneumonia    as a small child    PVC (premature ventricular contraction)    TIA (transient ischemic attack)      PAST SURGICAL HISTORY ([place]):  Past Surgical History:  Procedure Laterality Date   BREAST BIOPSY     BREAST EXCISIONAL BIOPSY Bilateral 12/13/1996   benign   CERVICAL SPINE SURGERY     CESAREAN SECTION     ORIF ANKLE FRACTURE Left 09/09/2020   Procedure: OPEN REDUCTION INTERNAL FIXATION (ORIF) ANKLE FRACTURE;   Surgeon: Earlie Server, MD;  Location: [facility] ORS;  Service: Orthopedics;  Laterality: Left;   TONSILLECTOMY       MEDICATIONS:  Prior to Admission medications   Medication Sig Start Date End Date Taking? Authorizing Provider  aspirin EC 81 MG tablet Take 81 mg by mouth daily.    Yes [provider]  atorvastatin (LIPITOR) 10 MG tablet Take 10 mg by mouth daily.   Yes [provider]  buPROPion (WELLBUTRIN XL) 150 MG 24 hr tablet Take 150 mg by mouth daily. (Take with '300mg'$  tablet to equal '450mg'$  total)   Yes [provider]  buPROPion (WELLBUTRIN XL) 300 MG 24 hr tablet Take 300 mg by mouth daily. (Take with '150mg'$  tablet to equal '450mg'$  total)   Yes [provider]  cetirizine (ZYRTEC) 10 MG tablet Take 10 mg by mouth daily as needed (itching).   Yes [provider]  cloNIDine (CATAPRES) 0.1 MG tablet Take 0.1 mg by mouth 2 (two) times daily.    Yes [provider]  diazepam (VALIUM) 5 MG tablet Take 5 mg by mouth daily as needed for muscle spasms.  08/15/16  Yes [provider]  diltiazem (DILACOR XR) 240 MG 24 hr capsule Take 240 mg by mouth daily.  Yes [provider]  diphenhydrAMINE (BENADRYL) 25 mg capsule Take 25 mg by mouth every 6 (six) hours as needed for itching.   Yes [provider]  famotidine (PEPCID) 20 MG tablet Take 20 mg by mouth 2 (two) times daily as needed for heartburn.   Yes [provider]  losartan-hydrochlorothiazide (HYZAAR) 100-12.5 MG tablet Take 1 tablet by mouth daily.    Yes [provider]  ondansetron (ZOFRAN-ODT) 4 MG disintegrating tablet Take 2 mg by mouth every 6 (six) hours as needed for nausea or vomiting.   Yes [provider]  PARoxetine (PAXIL) 40 MG tablet Take 40 mg by mouth daily.    Yes [provider]  sulfamethoxazole-trimethoprim (BACTRIM DS) 800-160 MG tablet Take 1 tablet by mouth 2 (two) times daily. 12/20/22 12/27/22 Yes [provider]  Tiotropium Bromide-Olodaterol 2.5-2.5 MCG/ACT AERS Inhale 2 puffs into the lungs daily.   Yes [provider]  traMADol (ULTRAM) 50 MG tablet Take 50 mg by mouth every 6 (six) hours as needed for moderate pain.   Yes [provider]     ALLERGIES:  Allergies  Allergen Reactions   Hydrocodone Itching and Rash   Augmentin [Amoxicillin-Pot Clavulanate] Other (See Comments)    C.Diff colitis   Celexa [Citalopram Hydrobromide] Itching   Ciprofloxacin Itching   Codeine Itching and Swelling   Coppertone Sport Spray Spf30 [Sunscreens] Itching   Felodipine Other (See Comments)    Unknown   Norvasc [Amlodipine Besylate] Swelling    Ankle swelling   Zoloft [Sertraline Hcl] Itching and Rash     SOCIAL HISTORY:  Social History   Socioeconomic History   Marital status: Married    Spouse name: Not on file   Number of children: Not on file   Years of education: Not on file   Highest education level: Not on file  Occupational History   Occupation: disabled  Tobacco Use   Smoking status: Former    Types: Cigarettes    Quit date: 10/24/2017    Years since quitting: 5.1   Smokeless tobacco: Never  Vaping Use   Vaping Use: Never used  Substance and Sexual Activity   Alcohol use: Not Currently    Comment: hx of    Drug use: No   Sexual activity: Not on file  Other Topics Concern   Not on file  Social History Narrative   Lives with husband in a one story home.  Has 2 grown children.  On disability.  Education: some college.    Social Determinants of Health   Financial Resource Strain: Not on file  Food Insecurity: Not on file  Transportation Needs: Not on file  Physical Activity: Not on file  Stress: Not on file  Social Connections: Not on file  Intimate Partner Violence: Not on file      FAMILY HISTORY:  Family History  Problem Relation Age of Onset   Alcohol abuse Mother    Hypertension Father    Heart attack Father    Heart disease  Father      REVIEW OF SYSTEMS:  Constitutional: denies weight loss, chills, or sweats.  Positive for fever Eyes: denies any other vision changes, history of eye injury  ENT: denies sore throat, hearing problems  Respiratory: denies shortness of breath, wheezing  Cardiovascular: denies chest pain, palpitations  Gastrointestinal: 30 abdominal pain, nausea and vomiting Genitourinary: denies burning with urination or urinary frequency Musculoskeletal: denies any other joint pains or cramps  Skin: denies any  other rashes or skin discolorations  Neurological: denies any other headache, positive for dizziness, weakness  Psychiatric: denies any other depression, anxiety   All other review of systems were negative   VITAL SIGNS:  Temp:  [97.8 F (36.6 C)-98.6 F (37 C)] 98.6 F (37 C) (03/04 1647) Pulse Rate:  [64-76] 76 (03/04 1647) Resp:  [16-18] 16 (03/04 1647) BP: (115-145)/(67-80) 145/78 (03/04 1647) SpO2:  [95 %-99 %] 97 % (03/04 1647)     Height: 5' (152.4 cm) Weight: 83.9 kg BMI (Calculated): 36.13   INTAKE/OUTPUT:  This shift: No intake/output data recorded.  Last 2 shifts: '@IOLAST2SHIFTS'$ @   PHYSICAL EXAM:  Constitutional:  -- Normal body habitus  -- Awake, alert, and oriented x3  Eyes:  -- Pupils equally round and reactive to light  -- No scleral icterus  Ear, nose, and throat:  -- No jugular venous distension  Pulmonary:  -- No crackles  -- Equal breath sounds bilaterally -- Breathing non-labored at rest Cardiovascular:  -- S1, S2 present  -- No pericardial rubs Gastrointestinal:  -- Abdomen soft, nontender, non-distended, no guarding or rebound tenderness -- No abdominal masses appreciated, pulsatile or otherwise  Musculoskeletal and Integumentary:  -- Wounds: None appreciated -- Extremities: B/L UE and LE FROM, hands and feet warm, no edema  Neurologic:  -- Motor function: intact and symmetric -- Sensation: intact and symmetric   Labs:     Latest Ref  Rng & Units 12/26/2022    4:17 AM 12/25/2022    4:18 AM 12/24/2022    3:26 PM  CBC  WBC 4.0 - 10.5 K/uL 5.4  5.8  5.8   Hemoglobin 12.0 - 15.0 g/dL 11.7  12.0  13.1   Hematocrit 36.0 - 46.0 % 34.7  34.6  37.7   Platelets 150 - 400 K/uL 251  282  306       Latest Ref Rng & Units 12/26/2022    4:17 AM 12/25/2022   11:15 AM 12/25/2022    4:18 AM  CMP  Glucose 70 - 99 mg/dL 101   93   BUN 8 - 23 mg/dL 12   7   Creatinine 0.44 - 1.00 mg/dL 0.79   0.77   Sodium 135 - 145 mmol/L 133  127  126   Potassium 3.5 - 5.1 mmol/L 4.1   3.6   Chloride 98 - 111 mmol/L 97   89   CO2 22 - 32 mmol/L 28   31   Calcium 8.9 - 10.3 mg/dL 9.1   8.9   Total Protein 6.5 - 8.1 g/dL   6.4   Total Bilirubin 0.3 - 1.2 mg/dL   0.5   Alkaline Phos 38 - 126 U/L   93   AST 15 - 41 U/L   22   ALT 0 - 44 U/L   25     Imaging studies:  EXAM: NUCLEAR MEDICINE HEPATOBILIARY IMAGING WITH GALLBLADDER EF   TECHNIQUE: Sequential images of the abdomen were obtained out to 60 minutes following intravenous administration of radiopharmaceutical. After oral ingestion of Ensure, gallbladder ejection fraction was determined. At 60 min, normal ejection fraction is greater than 33%.   RADIOPHARMACEUTICALS:  5.24 mCi Tc-68m Choletec IV   [place]PARISON:  [place].   FINDINGS: Prompt uptake and biliary excretion of activity by the liver is seen. Gallbladder activity is visualized, consistent with patency of cystic duct. Biliary activity passes into small bowel, consistent with patent common bile duct.   Calculated gallbladder  ejection fraction is 2%. (Normal gallbladder ejection fraction with Ensure is greater than 33%.)   IMPRESSION: Reduced gallbladder ejection fraction as can be seen with chronic cholecystitis/biliary dyskinesia.     Electronically Signed   By: Dahlia Bailiff M.D.   On: 12/26/2022 11:31    Assessment/Plan:  67 y.o. female with right back pain, complicated by pertinent comorbidities including  gallbladder dyskinesia, chronic cholecystitis, COPD, GERD, hyperlipidemia, hypertension.  Upon evaluation of patient I discussed that right back pain and flank pain is atypical presentation of cholecystitis.  Since other workup has been negative I discussed with her that with that ejection fraction of 2% and the fact that she has stones and chronic cholecystitis it would be reasonable to do cholecystectomy but I cannot guarantee that her right flank pain or back pain will resolve or that it might recur.  She endorses that she would like to proceed with cholecystectomy.  She was oriented about the risk of surgery including bleeding, infection, bile leak, injury to common bile duct, risk for anesthesia, pneumonia, DVT, PE, MI, among others.  The patient reports she understood and agreed to proceed with cholecystectomy.   Arnold Long, MD

## 2022-12-27 ENCOUNTER — Inpatient Hospital Stay: Payer: Medicare HMO | Admitting: Certified Registered"

## 2022-12-27 ENCOUNTER — Other Ambulatory Visit: Payer: Self-pay

## 2022-12-27 ENCOUNTER — Encounter
Admission: EM | Disposition: A | Discharge status: Home / Self Care | Payer: Self-pay | Source: Home / Self Care | Attending: Student

## 2022-12-27 DIAGNOSIS — E871 Hypo-osmolality and hyponatremia: Secondary | ICD-10-CM | POA: Diagnosis not present

## 2022-12-27 LAB — CBC
HCT: 34.9 % — ABNORMAL LOW (ref 36.0–46.0)
Hemoglobin: 11.7 g/dL — ABNORMAL LOW (ref 12.0–15.0)
MCH: 29.2 pg (ref 26.0–34.0)
MCHC: 33.5 g/dL (ref 30.0–36.0)
MCV: 87 fL (ref 80.0–100.0)
Platelets: 249 10*3/uL (ref 150–400)
RBC: 4.01 MIL/uL (ref 3.87–5.11)
RDW: 12.9 % (ref 11.5–15.5)
WBC: 6.8 10*3/uL (ref 4.0–10.5)
nRBC: 0 % (ref 0.0–0.2)

## 2022-12-27 LAB — BASIC METABOLIC PANEL
Anion gap: 6 (ref 5–15)
BUN: 12 mg/dL (ref 8–23)
CO2: 28 mmol/L (ref 22–32)
Calcium: 9.1 mg/dL (ref 8.9–10.3)
Chloride: 95 mmol/L — ABNORMAL LOW (ref 98–111)
Creatinine, Ser: 0.71 mg/dL (ref 0.44–1.00)
GFR, Estimated: >60 mL/min (ref >60)
Glucose, Bld: 113 mg/dL — ABNORMAL HIGH (ref 70–99)
Potassium: 4 mmol/L (ref 3.5–5.1)
Sodium: 129 mmol/L — ABNORMAL LOW (ref 135–145)

## 2022-12-27 LAB — PHOSPHORUS: Phosphorus: 4.1 mg/dL (ref 2.5–4.6)

## 2022-12-27 LAB — MAGNESIUM: Magnesium: 2.1 mg/dL (ref 1.7–2.4)

## 2022-12-27 SURGERY — CHOLECYSTECTOMY, ROBOT-ASSISTED, LAPAROSCOPIC
Anesthesia: General

## 2022-12-27 MED ORDER — DEXAMETHASONE SODIUM PHOSPHATE 10 MG/ML IJ SOLN
INTRAMUSCULAR | Status: DC | PRN
  Administered 2022-12-27: 10 mg via INTRAVENOUS

## 2022-12-27 MED ORDER — DROPERIDOL 2.5 MG/ML IJ SOLN: 0.6250 mg | Freq: Once | INTRAMUSCULAR | Status: DC | PRN

## 2022-12-27 MED ORDER — SUCCINYLCHOLINE CHLORIDE 200 MG/10ML IV SOSY
PREFILLED_SYRINGE | INTRAVENOUS | Status: DC | PRN
  Administered 2022-12-27: 100 mg via INTRAVENOUS

## 2022-12-27 MED ORDER — INDOCYANINE GREEN 25 MG IV SOLR
1.2500 mg | Freq: Once | INTRAVENOUS | Status: AC
  Administered 2022-12-27: 1.25 mg via INTRAVENOUS
  Filled 2022-12-27: qty 0.5

## 2022-12-27 MED ORDER — BUPIVACAINE-EPINEPHRINE 0.25% -1:200000 IJ SOLN
INTRAMUSCULAR | Status: DC | PRN
  Administered 2022-12-27: 20 mL

## 2022-12-27 MED ORDER — LACTATED RINGERS IV SOLN: INTRAVENOUS | Status: DC | PRN

## 2022-12-27 MED ORDER — VANCOMYCIN HCL IN DEXTROSE 1-5 GM/200ML-% IV SOLN
1000.0000 mg | Freq: Once | INTRAVENOUS | Status: AC
  Administered 2022-12-27: 1000 mg via INTRAVENOUS

## 2022-12-27 MED ORDER — FENTANYL CITRATE (PF) 100 MCG/2ML IJ SOLN: 25.0000 ug | INTRAMUSCULAR | Status: DC | PRN

## 2022-12-27 MED ORDER — OXYCODONE-ACETAMINOPHEN 5-325 MG PO TABS
1.0000 | ORAL_TABLET | ORAL | Status: DC | PRN
  Administered 2022-12-27 – 2022-12-28 (×2): 1 via ORAL
  Filled 2022-12-27 (×2): qty 1

## 2022-12-27 MED ORDER — VANCOMYCIN HCL IN DEXTROSE 1-5 GM/200ML-% IV SOLN
INTRAVENOUS | Status: AC
  Filled 2022-12-27: qty 200

## 2022-12-27 MED ORDER — PHENYLEPHRINE HCL-NACL 20-0.9 MG/250ML-% IV SOLN
INTRAVENOUS | Status: DC | PRN
  Administered 2022-12-27: 10 ug/min via INTRAVENOUS

## 2022-12-27 MED ORDER — PROPOFOL 10 MG/ML IV BOLUS
INTRAVENOUS | Status: DC | PRN
  Administered 2022-12-27: 140 mg via INTRAVENOUS

## 2022-12-27 MED ORDER — ACETAMINOPHEN 10 MG/ML IV SOLN
INTRAVENOUS | Status: DC | PRN
  Administered 2022-12-27: 1000 mg via INTRAVENOUS

## 2022-12-27 MED ORDER — PROPOFOL 10 MG/ML IV BOLUS
INTRAVENOUS | Status: AC
  Filled 2022-12-27: qty 20

## 2022-12-27 MED ORDER — MIDAZOLAM HCL 2 MG/2ML IJ SOLN
INTRAMUSCULAR | Status: AC
  Filled 2022-12-27: qty 2

## 2022-12-27 MED ORDER — FENTANYL CITRATE (PF) 100 MCG/2ML IJ SOLN
INTRAMUSCULAR | Status: AC
  Filled 2022-12-27: qty 2

## 2022-12-27 MED ORDER — ONDANSETRON HCL 4 MG/2ML IJ SOLN: 4.0000 mg | Freq: Once | INTRAMUSCULAR | Status: DC | PRN

## 2022-12-27 MED ORDER — HYDRALAZINE HCL 20 MG/ML IJ SOLN: 10.0000 mg | Freq: Four times a day (QID) | INTRAMUSCULAR | Status: DC | PRN

## 2022-12-27 MED ORDER — SUGAMMADEX SODIUM 500 MG/5ML IV SOLN
INTRAVENOUS | Status: DC | PRN
  Administered 2022-12-27: 360 mg via INTRAVENOUS

## 2022-12-27 MED ORDER — GLYCOPYRROLATE 0.2 MG/ML IJ SOLN
INTRAMUSCULAR | Status: DC | PRN
  Administered 2022-12-27: .2 mg via INTRAVENOUS

## 2022-12-27 MED ORDER — "VISTASEAL 4 ML SINGLE DOSE KIT "
PACK | CUTANEOUS | Status: AC
  Filled 2022-12-27: qty 4

## 2022-12-27 MED ORDER — MIDAZOLAM HCL 2 MG/2ML IJ SOLN
INTRAMUSCULAR | Status: DC | PRN
  Administered 2022-12-27: 2 mg via INTRAVENOUS

## 2022-12-27 MED ORDER — FENTANYL CITRATE (PF) 100 MCG/2ML IJ SOLN
INTRAMUSCULAR | Status: DC | PRN
  Administered 2022-12-27 (×2): 50 ug via INTRAVENOUS

## 2022-12-27 MED ORDER — EPINEPHRINE PF 1 MG/ML IJ SOLN
INTRAMUSCULAR | Status: AC
  Filled 2022-12-27: qty 1

## 2022-12-27 MED ORDER — SODIUM CHLORIDE 1 G PO TABS
2.0000 g | ORAL_TABLET | Freq: Three times a day (TID) | ORAL | Status: DC
  Administered 2022-12-27 – 2022-12-28 (×3): 2 g via ORAL
  Filled 2022-12-27 (×2): qty 2

## 2022-12-27 MED ORDER — LIDOCAINE HCL (CARDIAC) PF 100 MG/5ML IV SOSY
PREFILLED_SYRINGE | INTRAVENOUS | Status: DC | PRN
  Administered 2022-12-27: 80 mg via INTRAVENOUS

## 2022-12-27 MED ORDER — ROCURONIUM BROMIDE 100 MG/10ML IV SOLN
INTRAVENOUS | Status: DC | PRN
  Administered 2022-12-27: 10 mg via INTRAVENOUS
  Administered 2022-12-27: 40 mg via INTRAVENOUS

## 2022-12-27 MED ORDER — "VISTASEAL 4 ML SINGLE DOSE KIT "
PACK | CUTANEOUS | Status: DC | PRN
  Administered 2022-12-27: 4 mL via TOPICAL

## 2022-12-27 MED ORDER — ONDANSETRON HCL 4 MG/2ML IJ SOLN
INTRAMUSCULAR | Status: DC | PRN
  Administered 2022-12-27 (×2): 4 mg via INTRAVENOUS

## 2022-12-27 MED ORDER — BUPIVACAINE HCL (PF) 0.25 % IJ SOLN
INTRAMUSCULAR | Status: AC
  Filled 2022-12-27: qty 30

## 2022-12-27 SURGICAL SUPPLY — 55 items
APPLICATOR VISTASEAL 35 (MISCELLANEOUS) IMPLANT
BAG PRESSURE INF REUSE 1000 (BAG) IMPLANT
BLADE SURG SZ11 CARB STEEL (BLADE) ×2 IMPLANT
CANNULA REDUC XI 12-8 STAPL (CANNULA) ×2
CANNULA REDUCER 12-8 DVNC XI (CANNULA) ×2 IMPLANT
CATH REDDICK CHOLANGI 4FR 50CM (CATHETERS) IMPLANT
CLIP LIGATING HEM O LOK PURPLE (MISCELLANEOUS) IMPLANT
CLIP LIGATING HEMO O LOK GREEN (MISCELLANEOUS) ×2 IMPLANT
DERMABOND ADVANCED .7 DNX12 (GAUZE/BANDAGES/DRESSINGS) ×2 IMPLANT
DRAPE ARM DVNC X/XI (DISPOSABLE) ×8 IMPLANT
DRAPE C-ARM XRAY 36X54 (DRAPES) IMPLANT
DRAPE COLUMN DVNC XI (DISPOSABLE) ×2 IMPLANT
DRAPE DA VINCI XI ARM (DISPOSABLE) ×8
DRAPE DA VINCI XI COLUMN (DISPOSABLE) ×2
ELECT REM PT RETURN 9FT ADLT (ELECTROSURGICAL) ×2
ELECTRODE REM PT RTRN 9FT ADLT (ELECTROSURGICAL) ×2 IMPLANT
GLOVE BIO SURGEON STRL SZ 6.5 (GLOVE) ×4 IMPLANT
GLOVE BIOGEL PI IND STRL 6.5 (GLOVE) ×4 IMPLANT
GOWN STRL REUS W/ TWL LRG LVL3 (GOWN DISPOSABLE) ×6 IMPLANT
GOWN STRL REUS W/TWL LRG LVL3 (GOWN DISPOSABLE) ×6
GRASPER SUT TROCAR 14GX15 (MISCELLANEOUS) ×2 IMPLANT
IRRIGATOR SUCT 8 DISP DVNC XI (IRRIGATION / IRRIGATOR) IMPLANT
IRRIGATOR SUCTION 8MM XI DISP (IRRIGATION / IRRIGATOR)
IV CATH ANGIO 12GX3 LT BLUE (NEEDLE) IMPLANT
IV NS 1000ML (IV SOLUTION)
IV NS 1000ML BAXH (IV SOLUTION) IMPLANT
KIT PINK PAD W/HEAD ARE REST (MISCELLANEOUS) ×2 IMPLANT
KIT PINK PAD W/HEAD ARM REST (MISCELLANEOUS) ×2 IMPLANT
LABEL OR SOLS (LABEL) ×2 IMPLANT
MANIFOLD NEPTUNE II (INSTRUMENTS) ×2 IMPLANT
NDL INSUFFLATION 14GA 120MM (NEEDLE) ×2 IMPLANT
NEEDLE HYPO 22GX1.5 SAFETY (NEEDLE) ×2 IMPLANT
NEEDLE INSUFFLATION 14GA 120MM (NEEDLE) ×2 IMPLANT
NS IRRIG 500ML POUR BTL (IV SOLUTION) ×2 IMPLANT
OBTURATOR OPTICAL STANDARD 8MM (TROCAR) ×2
OBTURATOR OPTICAL STND 8 DVNC (TROCAR) ×2
OBTURATOR OPTICALSTD 8 DVNC (TROCAR) ×2 IMPLANT
PACK LAP CHOLECYSTECTOMY (MISCELLANEOUS) ×2 IMPLANT
SEAL CANN UNIV 5-8 DVNC XI (MISCELLANEOUS) ×6 IMPLANT
SEAL XI 5MM-8MM UNIVERSAL (MISCELLANEOUS) ×6
SET TUBE SMOKE EVAC HIGH FLOW (TUBING) ×2 IMPLANT
SOL ELECTROSURG ANTI STICK (MISCELLANEOUS) ×2
SOLUTION ELECTROSURG ANTI STCK (MISCELLANEOUS) ×2 IMPLANT
SPIKE FLUID TRANSFER (MISCELLANEOUS) ×4 IMPLANT
SPONGE T-LAP 4X18 ~~LOC~~+RFID (SPONGE) IMPLANT
STAPLER CANNULA SEAL DVNC XI (STAPLE) ×2 IMPLANT
STAPLER CANNULA SEAL XI (STAPLE) ×2
SUT MNCRL 4-0 (SUTURE) ×2
SUT MNCRL 4-0 27XMFL (SUTURE) ×2
SUT VICRYL 0 UR6 27IN ABS (SUTURE) ×2 IMPLANT
SUTURE MNCRL 4-0 27XMF (SUTURE) ×2 IMPLANT
SYS BAG RETRIEVAL 10MM (BASKET) ×2
SYSTEM BAG RETRIEVAL 10MM (BASKET) ×2 IMPLANT
TRAP FLUID SMOKE EVACUATOR (MISCELLANEOUS) ×2 IMPLANT
WATER STERILE IRR 500ML POUR (IV SOLUTION) ×2 IMPLANT

## 2022-12-27 NOTE — Transfer of Care (Signed)
Immediate Anesthesia Transfer of Care Note  Patient: Christina Walsh  Procedure(s) Performed: XI ROBOTIC ASSISTED LAPAROSCOPIC CHOLECYSTECTOMY INDOCYANINE GREEN FLUORESCENCE IMAGING (ICG)  Patient Location: PACU  Anesthesia Type:General  Level of Consciousness: awake, drowsy, and patient cooperative  Airway & Oxygen Therapy: Patient Spontanous Breathing and Patient connected to nasal cannula oxygen  Post-op Assessment: Report given to RN and Post -op Vital signs reviewed and stable  Post vital signs: Reviewed and stable  Last Vitals:  Vitals Value Taken Time  BP 138/66 12/27/22 1556  Temp    Pulse 77 12/27/22 1559  Resp 17 12/27/22 1559  SpO2 100 % 12/27/22 1559  Vitals shown include unvalidated device data.  Last Pain:  Vitals:   12/27/22 1344  TempSrc: Temporal  PainSc: 0-No pain      Patients Stated Pain Goal: 0 ([phone] [phone])  Complications: No notable events documented.

## 2022-12-27 NOTE — Op Note (Signed)
Preoperative diagnosis: Chronic cholecystitis  Postoperative diagnosis: Same  Procedure: Robotic Assisted Laparoscopic Cholecystectomy.   Anesthesia: GETA   Surgeon: Dr. Windell Moment  Wound Classification: Clean Contaminated  Indications: Patient is a 67 y.o. female developed right flank pain and on workup was found to have cholelithiasis with a normal common duct and HIDA scan with gallbladder ejection fraction of 2% favoring chronic cholecystitis. Robotic Assisted Laparoscopic cholecystectomy was elected.  Findings:  Critical view of safety achieved Cystic duct and artery identified, ligated and divided Adequate hemostasis  Description of procedure: The patient was placed on the operating table in the supine position. General anesthesia was induced. A time-out was completed verifying correct patient, procedure, site, positioning, and implant(s) and/or special equipment prior to beginning this procedure. An orogastric tube was placed. The abdomen was prepped and draped in the usual sterile fashion.  An incision was made in a natural skin line below the umbilicus.  The fascia was elevated and the Veress needle inserted. Proper position was confirmed by aspiration and saline meniscus test.  The abdomen was insufflated with carbon dioxide to a pressure of 15 mmHg. The patient tolerated insufflation well. A 8-mm trocar was then inserted in optiview fashion.  The laparoscope was inserted and the abdomen inspected. No injuries from initial trocar placement were noted. Additional trocars were then inserted in the following locations: an 8-mm trocar in the left lateral abdomen, and another two 8-mm trocars to the right side of the abdomen 5 cm appart. The umbilical trocar was changed to a 12 mm trocar all under direct visualization. The abdomen was inspected and no abnormalities were found. The table was placed in the reverse Trendelenburg position with the right side up. The robotic arms were  docked and target anatomy identified. Instrument inserted under direct visualization.  Filmy adhesions between the gallbladder and omentum, duodenum and transverse colon were lysed with electrocautery. The dome of the gallbladder was grasped with a prograsp and retracted over the dome of the liver. The infundibulum was also grasped with an atraumatic grasper and retracted toward the right lower quadrant. This maneuver exposed Calot's triangle. The peritoneum overlying the gallbladder infundibulum was then incised and the cystic duct and cystic artery identified and circumferentially dissected. Critical view of safety reviewed before ligating any structure. Firefly images taken to visualize biliary ducts. The cystic duct and cystic artery were then doubly clipped and divided close to the gallbladder.  The gallbladder was then dissected from its peritoneal attachments by electrocautery. Hemostasis was checked and the gallbladder and contained stones were removed using an endoscopic retrieval bag. The gallbladder was passed off the table as a specimen. The gallbladder fossa was irrigated with Vistaseal and hemostasis was obtained. There was no evidence of bleeding from the gallbladder fossa or cystic artery or leakage of the bile from the cystic duct stump. Secondary trocars were removed under direct vision. No bleeding was noted. The robotic arms were undoked. The scope was withdrawn and the umbilical trocar removed. The abdomen was allowed to collapse. The fascia of the 72m trocar sites was closed with figure-of-eight 0 vicryl sutures. The skin was closed with subcuticular sutures of 4-0 monocryl and topical skin adhesive. The orogastric tube was removed.  The patient tolerated the procedure well and was taken to the postanesthesia care unit in stable condition.   Specimen: Gallbladder  Complications: None  EBL: 5 mL

## 2022-12-27 NOTE — Anesthesia Postprocedure Evaluation (Signed)
Anesthesia Post Note  Patient: Christina Walsh  Procedure(s) Performed: XI ROBOTIC ASSISTED LAPAROSCOPIC CHOLECYSTECTOMY INDOCYANINE GREEN FLUORESCENCE IMAGING (ICG)  Patient location during evaluation: PACU Anesthesia Type: General Level of consciousness: awake and alert Pain management: pain level controlled Vital Signs Assessment: post-procedure vital signs reviewed and stable Respiratory status: spontaneous breathing, nonlabored ventilation, respiratory function stable and patient connected to nasal cannula oxygen Cardiovascular status: blood pressure returned to baseline and stable Postop Assessment: no apparent nausea or vomiting Anesthetic complications: no   No notable events documented.   Last Vitals:  Vitals:   12/27/22 1756 12/27/22 2050  BP: (!) 147/98   Pulse: 71   Resp:    Temp: 36.7 C   SpO2: 96% 97%    Last Pain:  Vitals:   12/27/22 1756  TempSrc: Oral  PainSc:                  Martha Clan

## 2022-12-27 NOTE — Anesthesia Procedure Notes (Signed)
Procedure Name: Intubation Date/Time: 12/27/2022 2:31 PM  Performed by: Kelton Pillar, CRNAPre-anesthesia Checklist: Patient identified, Emergency Drugs available, Suction available and Patient being monitored Patient Re-evaluated:Patient Re-evaluated prior to induction Oxygen Delivery Method: Circle system utilized Preoxygenation: Pre-oxygenation with 100% oxygen Induction Type: IV induction Ventilation: Mask ventilation without difficulty Laryngoscope Size: McGraph and 3 Grade View: Grade I Tube type: Oral Tube size: 7.0 mm Number of attempts: 1 Airway Equipment and Method: Stylet and Oral airway Placement Confirmation: ETT inserted through vocal cords under direct vision, positive ETCO2, breath sounds checked- equal and bilateral and CO2 detector Secured at: 20 cm Tube secured with: Tape Dental Injury: Teeth and Oropharynx as per pre-operative assessment

## 2022-12-27 NOTE — Anesthesia Preprocedure Evaluation (Addendum)
Anesthesia Evaluation  Patient identified by MRN, date of birth, ID band Patient awake    Reviewed: Allergy & Precautions, NPO status , Patient's Chart, lab work & pertinent test results  History of Anesthesia Complications (+) Family history of anesthesia reaction  Airway Mallampati: III  TM Distance: >3 FB Neck ROM: full    Dental  (+) Edentulous Upper, Edentulous Lower   Pulmonary shortness of breath, sleep apnea and Continuous Positive Airway Pressure Ventilation , pneumonia, COPD,  COPD inhaler, former smoker   Pulmonary exam normal        Cardiovascular hypertension, Normal cardiovascular exam     Neuro/Psych  PSYCHIATRIC DISORDERS Anxiety Depression    TIA Neuromuscular disease negative neurological ROS  negative psych ROS   GI/Hepatic negative GI ROS, Neg liver ROS,GERD  Medicated and Controlled,,  Endo/Other    Morbid obesity  Renal/GU      Musculoskeletal  (+) Arthritis ,    Abdominal   Peds  Hematology negative hematology ROS (+)   Anesthesia Other Findings Past Medical History: No date: Achilles tendinitis No date: Alcoholism ([place]) No date: Anxiety No date: Arthritis No date: COPD (chronic obstructive pulmonary disease) (HCC)     Comment:  not used oxygen  at nite x 1-2 years  No date: Depression No date: Dyspnea     Comment:  with a lot of exertion  No date: Family history of adverse reaction to anesthesia     Comment:  sister- -N/V  No date: GERD (gastroesophageal reflux disease) No date: History of carpal tunnel surgery No date: Hyperlipidemia No date: Hypertension No date: OSA (obstructive sleep apnea)     Comment:  mild  supposed to use cpap  not used in 1-2 years  No date: Pneumonia     Comment:  as a small child  No date: PVC (premature ventricular contraction) No date: TIA (transient ischemic attack)  Past Surgical History: No date: BREAST BIOPSY 12/13/1996: BREAST EXCISIONAL  BIOPSY; Bilateral     Comment:  benign No date: CERVICAL SPINE SURGERY No date: CESAREAN SECTION 09/09/2020: ORIF ANKLE FRACTURE; Left     Comment:  Procedure: OPEN REDUCTION INTERNAL FIXATION (ORIF) ANKLE              FRACTURE;  Surgeon: Earlie Server, MD;  Location: [facility]               ORS;  Service: Orthopedics;  Laterality: Left; No date: TONSILLECTOMY  BMI    Body Mass Index: 36.13 kg/m      Reproductive/Obstetrics negative OB ROS                             Anesthesia Physical Anesthesia Plan  ASA: 3  Anesthesia Plan: General ETT   Post-op Pain Management:    Induction: Intravenous  PONV Risk Score and Plan: Ondansetron, Dexamethasone, Midazolam and Treatment may vary due to age or medical condition  Airway Management Planned: Oral ETT  Additional Equipment:   Intra-op Plan:   Post-operative Plan: Extubation in OR  Informed Consent: I have reviewed the patients History and Physical, chart, labs and discussed the procedure including the risks, benefits and alternatives for the proposed anesthesia with the patient or authorized representative who has indicated his/her understanding and acceptance.     Dental Advisory Given  Plan Discussed with: Anesthesiologist, CRNA and Surgeon  Anesthesia Plan Comments: (Patient consented for risks of anesthesia including but not limited to:  - adverse reactions  to medications - damage to eyes, teeth, lips or other oral mucosa - nerve damage due to positioning  - sore throat or hoarseness - Damage to heart, brain, nerves, lungs, other parts of body or loss of life  Patient voiced understanding.)       Anesthesia Quick Evaluation

## 2022-12-27 NOTE — Progress Notes (Signed)
[facility] Progress Note  Patient: Christina Walsh    [id number]  DOA: 12/24/2022     Date of Service: the patient was seen and examined on 12/27/2022  Chief Complaint  Patient presents with   Flank Pain   Brief hospital course: VONETTE MALCZEWSKI is a 67 y.o. female with medical history significant of COPD, GERD, hyperlipidemia, hypertension, chronic hyponatremia presenting with right flank pain and hyponatremia.  Patient reports approximately 1 month of progressive right-sided flank pain.  Patient is a poor historian, stated that she has a decreased appetite but unable to tell me any relation ship of the pain with food intake. Was seen at urgent care within the past 2 to 3 weeks for symptoms. Prescribed a course of Bactrim for presumed UTI with no resolution of symptoms.  ED workup: Sodium 120, hyponatremia, LFTs within normal range CT A/P positive for cholelithiasis, no any other significant findings. [facility] hospitalist was consulted for admission and further management as below.   Assessment and Plan:  Hypotonic hyponatremia could be secondary to polydipsia versus SIADH, it could be due to Wellbutrin and Paxil Held hydrochlorothiazide for now Serum osmolality 254, very low Na 120 ---127--133 --129 fluctuating 3/5 increased NaCl tabs 2 g p.o. 3 times daily  Monitor sodium level daily S/p gentle hydration NS 50 ml/hr x 1 day   Right flank pain, unknown cause could be secondary to cholelithiasis and biliary dyskinesia CTAP reviewed HIDA scan GB EF 3%, no any other acute findings General surgery consulted, patient agreed for lap chole, has been scheduled for today.   Mood disorder (HCC) Appear stable  Cont paxil and wellbutrin      COPD (chronic obstructive pulmonary disease) (HCC) Stable from a resp standpoint  Cont home inhalers   HTN (hypertension), HLD BP 130s-160s/100s on presentation  Hold HCTZ given hyponatremia  Continue clonidine and Cardizem CD home  dose Continued Lipitor 10 mg home dose 3/5 elevated BP, started hydralazine as needed  Body mass index is 36.13 kg/m.  Interventions:       Diet: Heart healthy diet, fluid restriction 1.5 L/day advised DVT Prophylaxis: Subcutaneous Lovenox   Advance goals of care discussion: Full code  Family Communication: family was present at bedside, at the time of interview.  The pt provided permission to discuss medical plan with the family. Opportunity was given to ask question and all questions were answered satisfactorily.   Disposition:  Pt is from Home, admitted with hyponatremia and right flank pain, still has low sodium and HIDA scan is pending, which precludes a safe discharge. Discharge to home, when clinically stable, may need few days to improve.  Subjective: No significant events overnight, patient still has pain on the right flank area, feels hot sensation.  Also has lower backache.  Patient agreed for lap chole.  Flu restriction was enforced, due to persistent low sodium level. Patient denies any other active issues   Physical Exam: General: NAD, lying comfortably Appear in no distress, affect appropriate Eyes: PERRLA ENT: Oral Mucosa Clear, moist  Neck: no JVD,  Cardiovascular: S1 and S2 Present, no Murmur,  Respiratory: good respiratory effort, Bilateral Air entry equal and Decreased, no Crackles, no wheezes Abdomen: Bowel Sound present, Soft and no tenderness,  Skin: no rashes Extremities: no Pedal edema, no calf tenderness Neurologic: without any new focal findings Gait not checked due to patient safety concerns  Vitals:   12/26/22 1647 12/26/22 2204 12/27/22 0538 12/27/22 0820  BP: (!) 145/78 (!) 161/100 (!) 143/72 Marland Kitchen)  146/78  Pulse: 76 67 60 63  Resp: '16 18 19 16  '$ Temp: 98.6 F (37 C) 98.2 F (36.8 C) 98.7 F (37.1 C) 98 F (36.7 C)  TempSrc:  Oral    SpO2: 97% 97% 100% 96%  Weight:      Height:       No intake or output data in the 24 hours ending  12/27/22 1236  Filed Weights   12/24/22 1523  Weight: 83.9 kg    Data Reviewed: I have personally reviewed and interpreted daily labs, tele strips, imagings as discussed above. I reviewed all nursing notes, pharmacy notes, vitals, pertinent old records I have discussed plan of care as described above with RN and patient/family.  CBC: Recent Labs  Lab 12/24/22 1526 12/25/22 0418 12/26/22 0417 12/27/22 0521  WBC 5.8 5.8 5.4 6.8  HGB 13.1 12.0 11.7* 11.7*  HCT 37.7 34.6* 34.7* 34.9*  MCV 84.2 84.2 87.4 87.0  PLT 306 282 251 0000000   Basic Metabolic Panel: Recent Labs  Lab 12/24/22 1526 12/24/22 1902 12/24/22 2300 12/25/22 0415 12/25/22 0418 12/25/22 1115 12/26/22 0417 12/27/22 0521  NA 120*   < > 122*  --  126* 127* 133* 129*  K 3.6  --   --   --  3.6  --  4.1 4.0  CL 83*  --   --   --  89*  --  97* 95*  CO2 26  --   --   --  31  --  28 28  GLUCOSE 113*  --   --   --  93  --  101* 113*  BUN 10  --   --   --  7*  --  12 12  CREATININE 0.89  --   --   --  0.77  --  0.79 0.71  CALCIUM 9.3  --   --   --  8.9  --  9.1 9.1  MG  --   --   --  2.1  --   --  2.1 2.1  PHOS  --   --   --  3.2  --   --  3.6 4.1   < > = values in this interval not displayed.    Studies: No results found.  Scheduled Meds:  arformoterol  15 mcg Nebulization BID   And   umeclidinium bromide  1 puff Inhalation Daily   aspirin EC  81 mg Oral Daily   atorvastatin  10 mg Oral QHS   buPROPion  450 mg Oral Daily   cloNIDine  0.1 mg Oral BID   diltiazem  240 mg Oral Daily   enoxaparin (LOVENOX) injection  40 mg Subcutaneous Q24H   PARoxetine  40 mg Oral Daily   sodium chloride  2 g Oral TID WC   Continuous Infusions:  promethazine (PHENERGAN) injection (IM or IVPB) 12.5 mg (12/24/22 1934)   PRN Meds: diazepam, famotidine, hydrALAZINE, ondansetron **OR** ondansetron (ZOFRAN) IV, mouth rinse, promethazine (PHENERGAN) injection (IM or IVPB), traMADol  Time spent: 35 minutes  Author: Val Riles. MD [facility] 12/27/2022 12:36 PM  To reach On-call, see care teams to locate the attending and reach out to them via [email or web address]. If 7PM-7AM, please contact night-coverage If you still have difficulty reaching the attending provider, please page the [facility] (Director on Call) for [facility] on amion for assistance.

## 2022-12-27 NOTE — Progress Notes (Signed)
Patient ID: Christina Walsh, female   DOB: 01/03/1956, 67 y.o.   MRN: [id number]     [place] [facility] Hospital Day(s): 2.   Interval History: Patient seen and examined, no acute events or new complaints overnight. Patient reports having right-sided subcostal pain today.  She endorses that she found the pain today being a little bit more anterior than the usual flank pain.  Pain does not radiate to other part of body.  She cannot identify any alleviating or aggravating factor.  Patient has discussed with daughter about having cholecystectomy.  Vital signs in last 24 hours: [min-max] current  Temp:  [98 F (36.7 C)-98.7 F (37.1 C)] 98 F (36.7 C) (03/05 0820) Pulse Rate:  [60-76] 63 (03/05 0820) Resp:  [16-19] 16 (03/05 0820) BP: (143-161)/(72-100) 146/78 (03/05 0820) SpO2:  [96 %-100 %] 96 % (03/05 0820)     Height: 5' (152.4 cm) Weight: 83.9 kg BMI (Calculated): 36.13   Physical Exam:  Constitutional: alert, cooperative and no distress  Respiratory: breathing non-labored at rest  Cardiovascular: regular rate and sinus rhythm  Gastrointestinal: soft, non-tender, and non-distended  Labs:     Latest Ref Rng & Units 12/27/2022    5:21 AM 12/26/2022    4:17 AM 12/25/2022    4:18 AM  CBC  WBC 4.0 - 10.5 K/uL 6.8  5.4  5.8   Hemoglobin 12.0 - 15.0 g/dL 11.7  11.7  12.0   Hematocrit 36.0 - 46.0 % 34.9  34.7  34.6   Platelets 150 - 400 K/uL 249  251  282       Latest Ref Rng & Units 12/27/2022    5:21 AM 12/26/2022    4:17 AM 12/25/2022   11:15 AM  CMP  Glucose 70 - 99 mg/dL 113  101    BUN 8 - 23 mg/dL 12  12    Creatinine 0.44 - 1.00 mg/dL 0.71  0.79    Sodium 135 - 145 mmol/L 129  133  127   Potassium 3.5 - 5.1 mmol/L 4.0  4.1    Chloride 98 - 111 mmol/L 95  97    CO2 22 - 32 mmol/L 28  28    Calcium 8.9 - 10.3 mg/dL 9.1  9.1      Imaging studies: No new pertinent imaging studies   Assessment/Plan:  67 y.o. female with chronic cholecystitis.  Patient initially admitted  with hyponatremia, dizziness and right flank pain.  Hyponatremia has improved and went back to baseline.  Patient today endorses that the pain is more localized to the subcostal area, these might be more consistent with the chronic cholecystitis.  I discussed with patient recommendation of cholecystectomy.  I discussed with patient there benefit and the risk of surgery.  Risk discussed included bleeding, infection, bile leak, injury to common bile duct, injury to adjacent organs, risk from anesthesia, among others.  The patient reports she understood and agreed to proceed with surgery.  Arnold Long, MD

## 2022-12-28 LAB — CBC
HCT: 36.5 % (ref 36.0–46.0)
Hemoglobin: 12.5 g/dL (ref 12.0–15.0)
MCH: 29.1 pg (ref 26.0–34.0)
MCHC: 34.2 g/dL (ref 30.0–36.0)
MCV: 84.9 fL (ref 80.0–100.0)
Platelets: 294 10*3/uL (ref 150–400)
RBC: 4.3 MIL/uL (ref 3.87–5.11)
RDW: 12.6 % (ref 11.5–15.5)
WBC: 6.8 10*3/uL (ref 4.0–10.5)
nRBC: 0 % (ref 0.0–0.2)

## 2022-12-28 LAB — BASIC METABOLIC PANEL
Anion gap: 10 (ref 5–15)
BUN: 8 mg/dL (ref 8–23)
CO2: 26 mmol/L (ref 22–32)
Calcium: 9.8 mg/dL (ref 8.9–10.3)
Chloride: 93 mmol/L — ABNORMAL LOW (ref 98–111)
Creatinine, Ser: 0.67 mg/dL (ref 0.44–1.00)
GFR, Estimated: >60 mL/min (ref >60)
Glucose, Bld: 156 mg/dL — ABNORMAL HIGH (ref 70–99)
Potassium: 3.7 mmol/L (ref 3.5–5.1)
Sodium: 129 mmol/L — ABNORMAL LOW (ref 135–145)

## 2022-12-28 LAB — MAGNESIUM: Magnesium: 2.1 mg/dL (ref 1.7–2.4)

## 2022-12-28 LAB — PHOSPHORUS: Phosphorus: 2.9 mg/dL (ref 2.5–4.6)

## 2022-12-28 MED ORDER — OXYCODONE-ACETAMINOPHEN 5-325 MG PO TABS: 1.0000 | ORAL_TABLET | ORAL | 0 refills | Status: AC | PRN

## 2022-12-28 MED ORDER — LOSARTAN POTASSIUM 50 MG PO TABS
100.0000 mg | ORAL_TABLET | Freq: Every day | ORAL | Status: DC
  Administered 2022-12-28 – 2022-12-29 (×2): 100 mg via ORAL
  Filled 2022-12-28 (×2): qty 2

## 2022-12-28 MED ORDER — SODIUM CHLORIDE 1 G PO TABS
2.0000 g | ORAL_TABLET | Freq: Three times a day (TID) | ORAL | Status: DC
  Administered 2022-12-28 – 2022-12-29 (×2): 2 g via ORAL
  Filled 2022-12-28 (×2): qty 2

## 2022-12-28 NOTE — Progress Notes (Signed)
Mobility Specialist - Progress Note   12/28/22 1416  Mobility  Activity Ambulated with assistance in hallway  Level of Assistance Standby assist, set-up cues, supervision of patient - no hands on  Assistive Device Front wheel walker  Distance Ambulated (ft) 180 ft  Activity Response Tolerated well  Mobility Referral Yes  $Mobility charge 1 Mobility   Pt sitting in recliner on RA upon arrival. Pt STS and ambulates in hallway SBA with no LOB noted. Pt returns to recliner with needs in reach.   Gretchen Short  Mobility Specialist  12/28/22 2:17 PM

## 2022-12-28 NOTE — Discharge Instructions (Signed)

## 2022-12-28 NOTE — Care Management Important Message (Signed)
Important Message  Patient Details  Name: Christina Walsh MRN: [id number] Date of Birth: [birth year]   Medicare Important Message Given:  Other (see comment)  Attempted to reach patient via room phone [phone]) to review Medicare IM.  Unable to reach at this time.    Dannette Barbara 12/28/2022, 2:31 PM

## 2022-12-28 NOTE — Progress Notes (Signed)
[facility] Progress Note  Patient: Christina Walsh    [id number]  DOA: 12/24/2022     Date of Service: the patient was seen and examined on 12/28/2022  Chief Complaint  Patient presents with   Flank Pain   Brief hospital course: Christina Walsh is a 67 y.o. female with medical history significant of COPD, GERD, hyperlipidemia, hypertension, chronic hyponatremia presenting with right flank pain and hyponatremia.  Patient reports approximately 1 month of progressive right-sided flank pain.  Patient is a poor historian, stated that she has a decreased appetite but unable to tell me any relation ship of the pain with food intake. Was seen at urgent care within the past 2 to 3 weeks for symptoms. Prescribed a course of Bactrim for presumed UTI with no resolution of symptoms.  ED workup: Sodium 120, hyponatremia, LFTs within normal range CT A/P positive for cholelithiasis, no any other significant findings. [facility] hospitalist was consulted for admission and further management as below.   Assessment and Plan:  Hypotonic hyponatremia could be secondary to polydipsia versus SIADH, it could be due to Wellbutrin and Paxil Held hydrochlorothiazide for now Serum osmolality 254, very low.  Continue fluid restriction 1.5 L/day Na 120 ---127--133 --129 fluctuating 3/5 increased NaCl tabs 2 g p.o. 3 times daily  Monitor sodium level daily S/p gentle hydration NS 50 ml/hr x 1 day   Cholelithiasis and chronic cholecystitis Presented with right flank pain.  Found to have gallstones and biliary dyskinesia CTAP reviewed HIDA scan GB EF 3%, no any other acute findings General surgery consulted, s/p robotic assisted lap chole on 12/27/2022, patient tolerated procedure well.  Continue as needed medication for pain control, cleared by general surgery to discharge and follow-up in 2 weeks.   Mood disorder  Appear stable  Cont paxil and wellbutrin      COPD (chronic obstructive pulmonary disease)  (HCC) Stable from a resp standpoint  Cont home inhalers   HTN (hypertension), HLD BP 130s-160s/100s on presentation  Hold HCTZ given hyponatremia  Continue clonidine and Cardizem CD home dose Continued Lipitor 10 mg home dose 3/5 elevated BP, started hydralazine as needed  Body mass index is 36.13 kg/m.  Interventions:       Diet: Heart healthy diet, fluid restriction 1.5 L/day advised DVT Prophylaxis: Subcutaneous Lovenox   Advance goals of care discussion: Full code  Family Communication: family was present at bedside, at the time of interview.  The pt provided permission to discuss medical plan with the family. Opportunity was given to ask question and all questions were answered satisfactorily.   Disposition:  Pt is from Home, admitted with hyponatremia and right flank pain, HIDA scan showed low EF of GB. S/p Lap chole on 3/5, still has low sodium, which precludes a safe discharge. Discharge to home, most likely tomorrow a.m.   Subjective: No significant events overnight, patient no pain on the left side now due to laparoscopic incisions, right flank pain resolved after surgery.  Patient tolerated diet well. Sodium level is still low, so we will continue to monitor today and plan for disposition tomorrow a.m.   Physical Exam: General: NAD, lying comfortably Appear in no distress, affect appropriate Eyes: PERRLA ENT: Oral Mucosa Clear, moist  Neck: no JVD,  Cardiovascular: S1 and S2 Present, no Murmur,  Respiratory: good respiratory effort, Bilateral Air entry equal and Decreased, no Crackles, no wheezes Abdomen: Bowel Sound present, Soft and mild left side  tenderness,  Skin: no rashes Extremities: no Pedal edema,  no calf tenderness Neurologic: without any new focal findings Gait not checked due to patient safety concerns  Vitals:   12/27/22 2050 12/27/22 2233 12/28/22 0727 12/28/22 0746  BP:  (!) 143/96  (!) 160/96  Pulse:  93  (!) 102  Resp:  19  20  Temp:   98.6 F (37 C)    TempSrc:      SpO2: 97% 96% 95% 99%  Weight:      Height:        Intake/Output Summary (Last 24 hours) at 12/28/2022 1249 Last data filed at 12/27/2022 1630 Gross per 24 hour  Intake 1560 ml  Output 5 ml  Net 1555 ml    Filed Weights   12/24/22 1523  Weight: 83.9 kg    Data Reviewed: I have personally reviewed and interpreted daily labs, tele strips, imagings as discussed above. I reviewed all nursing notes, pharmacy notes, vitals, pertinent old records I have discussed plan of care as described above with RN and patient/family.  CBC: Recent Labs  Lab 12/24/22 1526 12/25/22 0418 12/26/22 0417 12/27/22 0521 12/28/22 0354  WBC 5.8 5.8 5.4 6.8 6.8  HGB 13.1 12.0 11.7* 11.7* 12.5  HCT 37.7 34.6* 34.7* 34.9* 36.5  MCV 84.2 84.2 87.4 87.0 84.9  PLT 306 282 251 249 [id number]   Basic Metabolic Panel: Recent Labs  Lab 12/24/22 1526 12/24/22 1902 12/25/22 0415 12/25/22 0418 12/25/22 1115 12/26/22 0417 12/27/22 0521 12/28/22 0354  NA 120*   < >  --  126* 127* 133* 129* 129*  K 3.6  --   --  3.6  --  4.1 4.0 3.7  CL 83*  --   --  89*  --  97* 95* 93*  CO2 26  --   --  31  --  '28 28 26  '$ GLUCOSE 113*  --   --  93  --  101* 113* 156*  BUN 10  --   --  7*  --  '12 12 8  '$ CREATININE 0.89  --   --  0.77  --  0.79 0.71 0.67  CALCIUM 9.3  --   --  8.9  --  9.1 9.1 9.8  MG  --   --  2.1  --   --  2.1 2.1 2.1  PHOS  --   --  3.2  --   --  3.6 4.1 2.9   < > = values in this interval not displayed.    Studies: No results found.  Scheduled Meds:  arformoterol  15 mcg Nebulization BID   And   umeclidinium bromide  1 puff Inhalation Daily   aspirin EC  81 mg Oral Daily   atorvastatin  10 mg Oral QHS   buPROPion  450 mg Oral Daily   cloNIDine  0.1 mg Oral BID   diltiazem  240 mg Oral Daily   enoxaparin (LOVENOX) injection  40 mg Subcutaneous Q24H   losartan  100 mg Oral Daily   PARoxetine  40 mg Oral Daily   sodium chloride  2 g Oral TID WC   Continuous  Infusions:  promethazine (PHENERGAN) injection (IM or IVPB) Stopped (12/28/22 0151)   PRN Meds: diazepam, famotidine, hydrALAZINE, ondansetron **OR** ondansetron (ZOFRAN) IV, mouth rinse, oxyCODONE-acetaminophen, promethazine (PHENERGAN) injection (IM or IVPB), traMADol  Time spent: 35 minutes  Author: Val Riles. MD [facility] 12/28/2022 12:49 PM  To reach On-call, see care teams to locate the attending and reach out to them via [email or web address]. If  7PM-7AM, please contact night-coverage If you still have difficulty reaching the attending provider, please page the [facility] (Director on Call) for [facility] on amion for assistance.

## 2022-12-28 NOTE — Progress Notes (Signed)
Patient ID: Christina Walsh, female   DOB: February 12, 1956, 67 y.o.   MRN: [id number]     [place] Hospital Day(s): 3.   Interval History: Patient seen and examined, no acute events or new complaints overnight. Patient reports feeling better this morning.  She has some pain yesterday but it was controlled with pain medications.  She was able to tolerate diet.  No issues with the wound.  Vital signs in last 24 hours: [min-max] current  Temp:  [97.7 F (36.5 C)-98.6 F (37 C)] 98.6 F (37 C) (03/05 2233) Pulse Rate:  [61-102] 102 (03/06 0746) Resp:  [13-21] 20 (03/06 0746) BP: (116-160)/(66-98) 160/96 (03/06 0746) SpO2:  [95 %-100 %] 99 % (03/06 0746)     Height: 5' (152.4 cm) Weight: 83.9 kg BMI (Calculated): 36.13   Physical Exam:  Constitutional: alert, cooperative and no distress  Respiratory: breathing non-labored at rest  Cardiovascular: regular rate and sinus rhythm  Gastrointestinal: soft, non-tender, and non-distended  Labs:     Latest Ref Rng & Units 12/28/2022    3:54 AM 12/27/2022    5:21 AM 12/26/2022    4:17 AM  CBC  WBC 4.0 - 10.5 K/uL 6.8  6.8  5.4   Hemoglobin 12.0 - 15.0 g/dL 12.5  11.7  11.7   Hematocrit 36.0 - 46.0 % 36.5  34.9  34.7   Platelets 150 - 400 K/uL 294  249  251       Latest Ref Rng & Units 12/28/2022    3:54 AM 12/27/2022    5:21 AM 12/26/2022    4:17 AM  CMP  Glucose 70 - 99 mg/dL 156  113  101   BUN 8 - 23 mg/dL '8  12  12   '$ Creatinine 0.44 - 1.00 mg/dL 0.67  0.71  0.79   Sodium 135 - 145 mmol/L 129  129  133   Potassium 3.5 - 5.1 mmol/L 3.7  4.0  4.1   Chloride 98 - 111 mmol/L 93  95  97   CO2 22 - 32 mmol/L '26  28  28   '$ Calcium 8.9 - 10.3 mg/dL 9.8  9.1  9.1     Imaging studies: No new pertinent imaging studies   Assessment/Plan:  67 y.o. female with cholecystitis 1 Day Post-Op s/p robotic cholecystectomy, complicated by pertinent comorbidities including chronic hyponatremia.  -Doing well post cholecystectomy. -Labs within normal  limits -Pain control with current pain medication -Patient tolerating diet -From surgical standpoint patient can be discharged when medically stable.  I will follow-up in my office in 2-week for wound follow-up. -Home pain medications has been prescribed.  Discharge instructions also included.  Arnold Long, MD

## 2022-12-29 LAB — BASIC METABOLIC PANEL
Anion gap: 9 (ref 5–15)
BUN: 12 mg/dL (ref 8–23)
CO2: 28 mmol/L (ref 22–32)
Calcium: 9.2 mg/dL (ref 8.9–10.3)
Chloride: 94 mmol/L — ABNORMAL LOW (ref 98–111)
Creatinine, Ser: 0.72 mg/dL (ref 0.44–1.00)
GFR, Estimated: >60 mL/min (ref >60)
Glucose, Bld: 120 mg/dL — ABNORMAL HIGH (ref 70–99)
Potassium: 4 mmol/L (ref 3.5–5.1)
Sodium: 131 mmol/L — ABNORMAL LOW (ref 135–145)

## 2022-12-29 LAB — CBC
HCT: 32.2 % — ABNORMAL LOW (ref 36.0–46.0)
Hemoglobin: 10.8 g/dL — ABNORMAL LOW (ref 12.0–15.0)
MCH: 29 pg (ref 26.0–34.0)
MCHC: 33.5 g/dL (ref 30.0–36.0)
MCV: 86.3 fL (ref 80.0–100.0)
Platelets: 277 10*3/uL (ref 150–400)
RBC: 3.73 MIL/uL — ABNORMAL LOW (ref 3.87–5.11)
RDW: 13 % (ref 11.5–15.5)
WBC: 10.3 10*3/uL (ref 4.0–10.5)
nRBC: 0 % (ref 0.0–0.2)

## 2022-12-29 LAB — SURGICAL PATHOLOGY

## 2022-12-29 MED ORDER — LOSARTAN POTASSIUM 100 MG PO TABS: 100.0000 mg | ORAL_TABLET | Freq: Every day | ORAL | 2 refills | Status: AC

## 2022-12-29 NOTE — Discharge Summary (Signed)
[facility] Discharge Summary   Patient: Christina Walsh [id number]  PCP: Charlane Ferretti, MD  Date of admission: 12/24/2022   Date of discharge:  12/29/2022     Discharge Diagnoses:  Principal Problem:   Hyponatremia Active Problems:   Flank pain   HTN (hypertension)   COPD (chronic obstructive pulmonary disease) ([place])   Mood disorder ([place])   Admitted From: Home Disposition:  Home  Recommendations for Outpatient Follow-up:  PCP: in 1 wk Follow-up general surgery in 1 week Follow up LABS/TEST: BMP in 1 week to check sodium level   Follow-up Information     Herbert Pun, MD. Go on 01/12/2023.   Specialty: General Surgery Why: '@9'$ :15am Contact information: [address] [place] [address] [phone]                Diet recommendation: Cardiac diet  Activity: The patient is advised to gradually reintroduce usual activities, as tolerated  Discharge Condition: stable  Code Status: Full code   History of present illness: As per the H and P dictated on admission. Hospital Course:  NAESHA Walsh is a 67 y.o. female with medical history significant of COPD, GERD, hyperlipidemia, hypertension, chronic hyponatremia presenting with right flank pain and hyponatremia.  Patient reports approximately 1 month of progressive right-sided flank pain.  Patient is a poor historian, stated that she has a decreased appetite but unable to tell me any relation ship of the pain with food intake. Was seen at urgent care within the past 2 to 3 weeks for symptoms. Prescribed a course of Bactrim for presumed UTI with no resolution of symptoms.  ED workup: Sodium 120, hyponatremia, LFTs within normal range CT A/P positive for cholelithiasis, no any other significant findings. [facility] hospitalist was consulted for admission and further management as below.   Assessment and Plan: # Hypotonic hyponatremia could be secondary to polydipsia vs SIADH, it could be due to  Wellbutrin and Paxil Discontinued hydrochlorothiazide for now due to hyponatremia Serum osmolality 254, very low.  Continue fluid restriction 1.5 L/day Na 120 ---127--133 --129 --131 fluctuating. S/p Nacl 2 g poTID given during hospital stay.  Patient received gentle IV fluid.  Sodium level is improving, continue fluid striction was advised and repeat BMP next week.  Follow-up with PCP for further management.  Consider tapering off Wellbutrin and Paxil distant hyponatremia. # Cholelithiasis and chronic cholecystitis s/p lap chole Presented with right flank pain.  Found to have gallstones and biliary dyskinesia CTAP reviewed. HIDA scan GB EF 3%, no any other acute findings General surgery consulted, s/p robotic assisted lap chole on 12/27/2022, patient tolerated procedure well.  Continue as needed medication for pain control, cleared by general surgery to discharge and follow-up in 2 weeks. # Mood disorder, Appear stable, Cont paxil and wellbutrin  # COPD (chronic obstructive pulmonary disease), Stable from a resp standpoint. Cont home inhalers # HTN (hypertension), HLD, discontinued hydrochlorothiazide due to hyponatremia, Continue clonidine, losartan and Cardizem CD home dose Continued Lipitor 10 mg home dose.  Patient was advised to monitor BP at home and follow with PCP to titrate medications accordingly  Body mass index is 36.13 kg/m.  Nutrition Interventions:     Pain control  - [place] Controlled Substance Reporting System database was not reviewed by me. -Percocet prescription was prescribed by general surgery for postop pain - Patient was instructed, not to drive, operate heavy machinery, perform activities at heights, swimming or participation in water activities or provide baby sitting services  while on Pain, Sleep and Anxiety Medications; until her outpatient Physician has advised to do so again.  - Also recommended to not to take more than prescribed Pain, Sleep and Anxiety  Medications.  Patient was ambulatory without any assistance. On the day of the discharge the patient's vitals were stable, and no other acute medical condition were reported by patient. the patient was felt safe to be discharge at Home.  Consultants: General surgery Procedures: Robotic assisted laparoscopic cholecystectomy  Discharge Exam: General: Appear in no distress, no Rash; Oral Mucosa Clear, moist. Cardiovascular: S1 and S2 Present, no Murmur, Respiratory: normal respiratory effort, Bilateral Air entry present and no Crackles, no wheezes Abdomen: Bowel Sound present, Soft and no tenderness, no hernia Extremities: no Pedal edema, no calf tenderness Neurology: alert and oriented to time, place, and person affect appropriate.  Filed Weights   12/24/22 1523  Weight: 83.9 kg   Vitals:   12/29/22 0451 12/29/22 0759  BP: (!) 162/76 (!) 158/90  Pulse: 64 67  Resp: 19 18  Temp: 98.7 F (37.1 C) 97.9 F (36.6 C)  SpO2: 98% (!) 89%    DISCHARGE MEDICATION: Allergies as of 12/29/2022       Reactions   Hydrocodone Itching, Rash   Augmentin [amoxicillin-pot Clavulanate] Other (See Comments)   C.Diff colitis   Celexa [citalopram Hydrobromide] Itching   Ciprofloxacin Itching   Codeine Itching, Swelling   Coppertone Sport Spray Spf30 [sunscreens] Itching   Felodipine Other (See Comments)   Unknown   Norvasc [amlodipine Besylate] Swelling   Ankle swelling   Zoloft [sertraline Hcl] Itching, Rash        Medication List     STOP taking these medications    losartan-hydrochlorothiazide 100-12.5 MG tablet Commonly known as: HYZAAR   sulfamethoxazole-trimethoprim 800-160 MG tablet Commonly known as: BACTRIM DS   traMADol 50 MG tablet Commonly known as: ULTRAM       TAKE these medications    aspirin EC 81 MG tablet Take 81 mg by mouth daily.   atorvastatin 10 MG tablet Commonly known as: LIPITOR Take 10 mg by mouth daily.   buPROPion 300 MG 24 hr  tablet Commonly known as: WELLBUTRIN XL Take 300 mg by mouth daily. (Take with '150mg'$  tablet to equal '450mg'$  total)   buPROPion 150 MG 24 hr tablet Commonly known as: WELLBUTRIN XL Take 150 mg by mouth daily. (Take with '300mg'$  tablet to equal '450mg'$  total)   cetirizine 10 MG tablet Commonly known as: ZYRTEC Take 10 mg by mouth daily as needed (itching).   cloNIDine 0.1 MG tablet Commonly known as: CATAPRES Take 0.1 mg by mouth 2 (two) times daily.   diazepam 5 MG tablet Commonly known as: VALIUM Take 5 mg by mouth daily as needed for muscle spasms.   diltiazem 240 MG 24 hr capsule Commonly known as: DILACOR XR Take 240 mg by mouth daily.   diphenhydrAMINE 25 mg capsule Commonly known as: BENADRYL Take 25 mg by mouth every 6 (six) hours as needed for itching.   famotidine 20 MG tablet Commonly known as: PEPCID Take 20 mg by mouth 2 (two) times daily as needed for heartburn.   losartan 100 MG tablet Commonly known as: COZAAR Take 1 tablet (100 mg total) by mouth daily. Start taking on: December 30, 2022   ondansetron 4 MG disintegrating tablet Commonly known as: ZOFRAN-ODT Take 2 mg by mouth every 6 (six) hours as needed for nausea or vomiting.   oxyCODONE-acetaminophen 5-325 MG tablet Commonly known  as: Percocet Take 1 tablet by mouth every 4 (four) hours as needed for severe pain.   PARoxetine 40 MG tablet Commonly known as: PAXIL Take 40 mg by mouth daily.   Tiotropium Bromide-Olodaterol 2.5-2.5 MCG/ACT Aers Inhale 2 puffs into the lungs daily.       Allergies  Allergen Reactions   Hydrocodone Itching and Rash   Augmentin [Amoxicillin-Pot Clavulanate] Other (See Comments)    C.Diff colitis   Celexa [Citalopram Hydrobromide] Itching   Ciprofloxacin Itching   Codeine Itching and Swelling   Coppertone Sport Spray Spf30 [Sunscreens] Itching   Felodipine Other (See Comments)    Unknown   Norvasc [Amlodipine Besylate] Swelling    Ankle swelling   Zoloft  [Sertraline Hcl] Itching and Rash   Discharge Instructions     Call MD for:  difficulty breathing, headache or visual disturbances   Complete by: As directed    Call MD for:  extreme fatigue   Complete by: As directed    Call MD for:  persistant dizziness or light-headedness   Complete by: As directed    Call MD for:  persistant nausea and vomiting   Complete by: As directed    Call MD for:  severe uncontrolled pain   Complete by: As directed    Call MD for:  temperature >100.4   Complete by: As directed    Diet - low sodium heart healthy   Complete by: As directed    Discharge instructions   Complete by: As directed    Follow-up with PCP in 1 week, repeat BMP after 1 week check serum sodium level, continue fluid striction 1.5 L/day. Follow-up with general surgery in 1 to 2 weeks for postop check.   Increase activity slowly   Complete by: As directed        The results of significant diagnostics from this hospitalization (including imaging, microbiology, ancillary and laboratory) are listed below for reference.    Significant Diagnostic Studies: NM Hepato W/EF  Result Date: 12/26/2022 CLINICAL DATA:  Biliary colic, recurrent, gallbladder dyskinesia suspected. EXAM: NUCLEAR MEDICINE HEPATOBILIARY IMAGING WITH GALLBLADDER EF TECHNIQUE: Sequential images of the abdomen were obtained out to 60 minutes following intravenous administration of radiopharmaceutical. After oral ingestion of Ensure, gallbladder ejection fraction was determined. At 60 min, normal ejection fraction is greater than 33%. RADIOPHARMACEUTICALS:  5.24 mCi Tc-73m Choletec IV COMPARISON:  [place] 20[place]. FINDINGS: Prompt uptake and biliary excretion of activity by the liver is seen. Gallbladder activity is visualized, consistent with patency of cystic duct. Biliary activity passes into small bowel, consistent with patent common bile duct. Calculated gallbladder ejection fraction is 2%. (Normal gallbladder ejection  fraction with Ensure is greater than 33%.) IMPRESSION: Reduced gallbladder ejection fraction as can be seen with chronic cholecystitis/biliary dyskinesia. Electronically Signed   By: JDahlia BailiffM.D.   On: 12/26/2022 11:31   [place] LIMITED RUQ (LIVER/GB)  Result Date: 12/24/2022 CLINICAL DATA:  [id number] pain [id number] EXAM: ULTRASOUND ABDOMEN LIMITED RIGHT UPPER QUADRANT COMPARISON:  Same day CT FINDINGS: Gallbladder: Gallbladder is contracted around multiple shadowing gallstones. No pericholecystic fluid or gallbladder wall thickening. No sonographic Murphy sign noted by sonographer. Common bile duct: Diameter: Visualized portion measures 3 mm, within normal limits. Liver: No focal lesion identified. Mildly increased parenchymal echogenicity. Portal vein is patent on color Doppler imaging with normal direction of blood flow towards the liver. Other: None. IMPRESSION: 1. Cholelithiasis without sonographic evidence of acute cholecystitis. If there is concern for biliary dyskinesia,  consider dedicated HIDA scan with ejection fraction calculation. 2. Mildly increased liver echogenicity which may reflect hepatic steatosis. Electronically Signed   By: Valentino Saxon M.D.   On: 12/24/2022 16:45   CT Renal Stone Study  Result Date: 12/24/2022 CLINICAL DATA:  Abdominal pain.  Right flank pain. EXAM: CT ABDOMEN AND PELVIS WITHOUT CONTRAST TECHNIQUE: Multidetector CT imaging of the abdomen and pelvis was performed following the standard protocol without IV contrast. RADIATION DOSE REDUCTION: This exam was performed according to the departmental dose-optimization program which includes automated exposure control, adjustment of the mA and/or kV according to patient size and/or use of iterative reconstruction technique. COMPARISON:  CT abdomen pelvis dated 07/13/2009. FINDINGS: Evaluation of this exam is limited in the absence of intravenous contrast. Lower chest: The visualized lungs are clear. No  intra-abdominal free air or free fluid. Hepatobiliary: The liver is unremarkable. No biliary dilatation. The gallbladder is unremarkable Pancreas: Unremarkable. No pancreatic ductal dilatation or surrounding inflammatory changes. Spleen: Normal in size without focal abnormality. Adrenals/Urinary Tract: The adrenal glands unremarkable. There is no hydronephrosis or nephrolithiasis on either side. The visualized ureters and urinary bladder appear unremarkable. Stomach/Bowel: There is no bowel obstruction or active inflammation. Several scattered sigmoid diverticula without active inflammatory changes. The appendix is not visualized with certainty. No inflammatory changes identified in the right lower quadrant. Vascular/Lymphatic: Moderate aortoiliac atherosclerotic disease. The IVC is unremarkable. No portal venous gas. There is no adenopathy. Reproductive: Hysterectomy.  No adnexal masses. Other: None Musculoskeletal: Osteopenia with multilevel degenerative changes of the spine. There is a transitional vertebra. There is compression fracture of superior endplate of 624THL with approximately 20% loss of vertebral body height, new since the prior CT, possibly acute or subacute. Correlation with clinical exam and point tenderness recommended. There is approximately 3 mm retropulsion of the superior posterior cortex. Severe degenerative changes at L3-L4 and L4-L5. IMPRESSION: 1. No hydronephrosis or nephrolithiasis. 2. No bowel obstruction. 3. Age indeterminate, likely subacute or acute, compression fracture of superior endplate of 624THL. Correlation with clinical exam and point tenderness recommended. 4.  Aortic Atherosclerosis (ICD10-I70.0). Electronically Signed   By: Anner Crete M.D.   On: 12/24/2022 16:22    Microbiology: No results found for this or any previous visit (from the past 240 hour(s)).   Labs: CBC: Recent Labs  Lab 12/25/22 0418 12/26/22 0417 12/27/22 0521 12/28/22 0354 12/29/22 0442  WBC  5.8 5.4 6.8 6.8 10.3  HGB 12.0 11.7* 11.7* 12.5 10.8*  HCT 34.6* 34.7* 34.9* 36.5 32.2*  MCV 84.2 87.4 87.0 84.9 86.3  PLT 282 251 249 294 [id number]   Basic Metabolic Panel: Recent Labs  Lab 12/25/22 0415 12/25/22 0418 12/25/22 1115 12/26/22 0417 12/27/22 0521 12/28/22 0354 12/29/22 0442  NA  --  126* 127* 133* 129* 129* 131*  K  --  3.6  --  4.1 4.0 3.7 4.0  CL  --  89*  --  97* 95* 93* 94*  CO2  --  31  --  '28 28 26 28  '$ GLUCOSE  --  93  --  101* 113* 156* 120*  BUN  --  7*  --  '12 12 8 12  '$ CREATININE  --  0.77  --  0.79 0.71 0.67 0.72  CALCIUM  --  8.9  --  9.1 9.1 9.8 9.2  MG 2.1  --   --  2.1 2.1 2.1  --   PHOS 3.2  --   --  3.6 4.1 2.9  --  Liver Function Tests: Recent Labs  Lab 12/24/22 1526 12/25/22 0418  AST 30 22  ALT 30 25  ALKPHOS 109 93  BILITOT 0.5 0.5  PROT 7.8 6.4*  ALBUMIN 4.6 3.8   Recent Labs  Lab 12/24/22 1526  LIPASE 32   No results for input(s): "AMMONIA" in the last 168 hours. Cardiac Enzymes: No results for input(s): "CKTOTAL", "CKMB", "CKMBINDEX", "TROPONINI" in the last 168 hours. BNP (last 3 results) Recent Labs    12/25/22 0415  BNP 41.0   CBG: No results for input(s): "GLUCAP" in the last 168 hours.  Time spent: 35 minutes  Signed:  Val Riles  [facility] 12/29/2022 11:03 AM

## 2022-12-29 NOTE — TOC CM/SW Note (Signed)
  Transition of Care [facility]) Screening Note   Patient Details  Name: Christina Walsh Date of Birth: 1956/04/12   [facility]) CM/SW Contact:    Gerilyn Pilgrim, LCSW Phone Number: 12/29/2022, 10:43 AM    Transition of Care Department [facility]) has reviewed patient and no TOC needs have been identified at this time. We will continue to monitor patient advancement through interdisciplinary progression rounds. If new patient transition needs arise, please place a TOC consult.

## 2022-12-29 NOTE — Progress Notes (Signed)
Discharge instructions reviewed with patient including followup visits and new medications.  Understanding was verbalized and all questions were answered.  Awaiting ride.

## 2022-12-29 NOTE — Progress Notes (Signed)
Mobility Specialist - Progress Note   12/29/22 0900  Mobility  Activity Ambulated independently in hallway  Level of [place] wheel walker  Distance Ambulated (ft) 350 ft  Activity Response Tolerated well  Mobility Referral Yes  $Mobility charge 1 Mobility   Pt sitting on EOB on RA upon arrival. Pt STS and ambulates in hallway indep. Pt returns to EOB with needs in reach and MD entering room.   Gretchen Short  Mobility Specialist  12/29/22 9:27 AM

## 2023-01-05 DIAGNOSIS — E871 Hypo-osmolality and hyponatremia: Secondary | ICD-10-CM | POA: Diagnosis not present

## 2023-01-05 DIAGNOSIS — Z9049 Acquired absence of other specified parts of digestive tract: Secondary | ICD-10-CM | POA: Diagnosis not present

## 2023-02-01 ENCOUNTER — Other Ambulatory Visit: Payer: Self-pay | Admitting: Internal Medicine

## 2023-02-01 DIAGNOSIS — M8589 Other specified disorders of bone density and structure, multiple sites: Secondary | ICD-10-CM

## 2023-02-09 DIAGNOSIS — J449 Chronic obstructive pulmonary disease, unspecified: Secondary | ICD-10-CM | POA: Diagnosis not present

## 2023-02-09 DIAGNOSIS — K219 Gastro-esophageal reflux disease without esophagitis: Secondary | ICD-10-CM | POA: Diagnosis not present

## 2023-02-09 DIAGNOSIS — F3341 Major depressive disorder, recurrent, in partial remission: Secondary | ICD-10-CM | POA: Diagnosis not present

## 2023-02-09 DIAGNOSIS — I10 Essential (primary) hypertension: Secondary | ICD-10-CM | POA: Diagnosis not present

## 2023-02-09 DIAGNOSIS — E7800 Pure hypercholesterolemia, unspecified: Secondary | ICD-10-CM | POA: Diagnosis not present

## 2023-03-10 DIAGNOSIS — R21 Rash and other nonspecific skin eruption: Secondary | ICD-10-CM | POA: Diagnosis not present

## 2023-03-10 DIAGNOSIS — E871 Hypo-osmolality and hyponatremia: Secondary | ICD-10-CM | POA: Diagnosis not present

## 2023-03-29 ENCOUNTER — Ambulatory Visit
Admission: RE | Admit: 2023-03-29 | Discharge: 2023-03-29 | Disposition: A | Discharge status: Home / Self Care | Payer: Medicare HMO | Source: Ambulatory Visit | Attending: Internal Medicine | Admitting: Internal Medicine

## 2023-03-29 DIAGNOSIS — Z8781 Personal history of (healed) traumatic fracture: Secondary | ICD-10-CM | POA: Diagnosis not present

## 2023-03-29 DIAGNOSIS — M810 Age-related osteoporosis without current pathological fracture: Secondary | ICD-10-CM | POA: Diagnosis not present

## 2023-03-29 DIAGNOSIS — N951 Menopausal and female climacteric states: Secondary | ICD-10-CM | POA: Diagnosis not present

## 2023-03-29 DIAGNOSIS — M8589 Other specified disorders of bone density and structure, multiple sites: Secondary | ICD-10-CM

## 2023-03-29 DIAGNOSIS — E349 Endocrine disorder, unspecified: Secondary | ICD-10-CM | POA: Diagnosis not present

## 2023-04-05 DIAGNOSIS — D2371 Other benign neoplasm of skin of right lower limb, including hip: Secondary | ICD-10-CM | POA: Diagnosis not present

## 2023-04-05 DIAGNOSIS — L270 Generalized skin eruption due to drugs and medicaments taken internally: Secondary | ICD-10-CM | POA: Diagnosis not present

## 2023-04-05 DIAGNOSIS — L271 Localized skin eruption due to drugs and medicaments taken internally: Secondary | ICD-10-CM | POA: Diagnosis not present

## 2023-06-05 DIAGNOSIS — Z0000 Encounter for general adult medical examination without abnormal findings: Secondary | ICD-10-CM | POA: Diagnosis not present

## 2023-06-05 DIAGNOSIS — F1021 Alcohol dependence, in remission: Secondary | ICD-10-CM | POA: Diagnosis not present

## 2023-06-05 DIAGNOSIS — I700 Atherosclerosis of aorta: Secondary | ICD-10-CM | POA: Diagnosis not present

## 2023-06-05 DIAGNOSIS — E7800 Pure hypercholesterolemia, unspecified: Secondary | ICD-10-CM | POA: Diagnosis not present

## 2023-06-05 DIAGNOSIS — Z23 Encounter for immunization: Secondary | ICD-10-CM | POA: Diagnosis not present

## 2023-06-05 DIAGNOSIS — I10 Essential (primary) hypertension: Secondary | ICD-10-CM | POA: Diagnosis not present

## 2023-06-05 DIAGNOSIS — Z79899 Other long term (current) drug therapy: Secondary | ICD-10-CM | POA: Diagnosis not present

## 2023-06-05 DIAGNOSIS — G4733 Obstructive sleep apnea (adult) (pediatric): Secondary | ICD-10-CM | POA: Diagnosis not present

## 2023-06-05 DIAGNOSIS — K219 Gastro-esophageal reflux disease without esophagitis: Secondary | ICD-10-CM | POA: Diagnosis not present

## 2023-06-05 DIAGNOSIS — F3341 Major depressive disorder, recurrent, in partial remission: Secondary | ICD-10-CM | POA: Diagnosis not present

## 2023-06-05 DIAGNOSIS — J449 Chronic obstructive pulmonary disease, unspecified: Secondary | ICD-10-CM | POA: Diagnosis not present

## 2023-06-05 DIAGNOSIS — M8588 Other specified disorders of bone density and structure, other site: Secondary | ICD-10-CM | POA: Diagnosis not present

## 2023-06-05 DIAGNOSIS — Z1331 Encounter for screening for depression: Secondary | ICD-10-CM | POA: Diagnosis not present

## 2023-06-06 ENCOUNTER — Other Ambulatory Visit: Payer: Self-pay | Admitting: Internal Medicine

## 2023-06-06 DIAGNOSIS — Z122 Encounter for screening for malignant neoplasm of respiratory organs: Secondary | ICD-10-CM

## 2023-06-27 ENCOUNTER — Ambulatory Visit
Admission: RE | Admit: 2023-06-27 | Discharge: 2023-06-27 | Disposition: A | Discharge status: Home / Self Care | Payer: Medicare HMO | Source: Ambulatory Visit | Attending: Internal Medicine | Admitting: Internal Medicine

## 2023-06-27 DIAGNOSIS — Z122 Encounter for screening for malignant neoplasm of respiratory organs: Secondary | ICD-10-CM

## 2023-06-27 DIAGNOSIS — I700 Atherosclerosis of aorta: Secondary | ICD-10-CM | POA: Diagnosis not present

## 2023-06-27 DIAGNOSIS — Z87891 Personal history of nicotine dependence: Secondary | ICD-10-CM | POA: Diagnosis not present

## 2023-06-27 DIAGNOSIS — J439 Emphysema, unspecified: Secondary | ICD-10-CM | POA: Diagnosis not present

## 2023-06-28 DIAGNOSIS — E871 Hypo-osmolality and hyponatremia: Secondary | ICD-10-CM | POA: Diagnosis not present

## 2023-07-04 ENCOUNTER — Ambulatory Visit
Admission: RE | Admit: 2023-07-04 | Discharge: 2023-07-04 | Disposition: A | Discharge status: Home / Self Care | Payer: Medicare HMO | Source: Ambulatory Visit | Attending: Internal Medicine | Admitting: Internal Medicine

## 2023-07-04 ENCOUNTER — Other Ambulatory Visit: Payer: Self-pay | Admitting: Internal Medicine

## 2023-07-04 DIAGNOSIS — M545 Low back pain, unspecified: Secondary | ICD-10-CM

## 2023-07-04 DIAGNOSIS — M549 Dorsalgia, unspecified: Secondary | ICD-10-CM | POA: Diagnosis not present

## 2023-08-07 DIAGNOSIS — J449 Chronic obstructive pulmonary disease, unspecified: Secondary | ICD-10-CM | POA: Diagnosis not present

## 2023-08-07 DIAGNOSIS — Z23 Encounter for immunization: Secondary | ICD-10-CM | POA: Diagnosis not present

## 2023-12-06 DIAGNOSIS — I10 Essential (primary) hypertension: Secondary | ICD-10-CM | POA: Diagnosis not present

## 2023-12-06 DIAGNOSIS — I2584 Coronary atherosclerosis due to calcified coronary lesion: Secondary | ICD-10-CM | POA: Diagnosis not present

## 2023-12-06 DIAGNOSIS — J449 Chronic obstructive pulmonary disease, unspecified: Secondary | ICD-10-CM | POA: Diagnosis not present

## 2023-12-06 DIAGNOSIS — M5459 Other low back pain: Secondary | ICD-10-CM | POA: Diagnosis not present

## 2023-12-06 DIAGNOSIS — R0609 Other forms of dyspnea: Secondary | ICD-10-CM | POA: Diagnosis not present

## 2024-03-04 ENCOUNTER — Ambulatory Visit: Attending: Cardiology | Admitting: Cardiology

## 2024-03-04 ENCOUNTER — Encounter: Payer: Self-pay | Admitting: Cardiology

## 2024-03-04 VITALS — BP 150/75 | HR 78 | Ht 60.0 in

## 2024-03-04 DIAGNOSIS — I2510 Atherosclerotic heart disease of native coronary artery without angina pectoris: Secondary | ICD-10-CM

## 2024-03-04 DIAGNOSIS — I10 Essential (primary) hypertension: Secondary | ICD-10-CM | POA: Diagnosis not present

## 2024-03-04 DIAGNOSIS — R0602 Shortness of breath: Secondary | ICD-10-CM

## 2024-03-04 DIAGNOSIS — E785 Hyperlipidemia, unspecified: Secondary | ICD-10-CM

## 2024-03-04 DIAGNOSIS — Z01812 Encounter for preprocedural laboratory examination: Secondary | ICD-10-CM | POA: Diagnosis not present

## 2024-03-04 MED ORDER — METOPROLOL TARTRATE 100 MG PO TABS: ORAL_TABLET | ORAL | 0 refills | Status: AC

## 2024-03-04 NOTE — Patient Instructions (Addendum)
 Medication Instructions:  Your physician recommends that you continue on your current medications as directed. Please refer to the Current Medication list given to you today.  *If you need a refill on your cardiac medications before your next appointment, please call your pharmacy*  Lab Work: Please complete a BMET today at our first floor lab before you leave. You do not need to be fasting for this blood test.  If you have labs (blood work) drawn today and your tests are completely normal, you will receive your results only by: MyChart Message (if you have MyChart) OR A paper copy in the mail If you have any lab test that is abnormal or we need to change your treatment, we will call you to review the results.  Testing/Procedures: Your physician has requested that you have an echocardiogram. Echocardiography is a painless test that uses sound waves to create images of your heart. It provides your doctor with information about the size and shape of your heart and how well your heart's chambers and valves are working. This procedure takes approximately one hour. There are no restrictions for this procedure. Please do NOT wear cologne, perfume, aftershave, or lotions (deodorant is allowed). Please arrive 15 minutes prior to your appointment time.  Please note: We ask at that you not bring children with you during ultrasound (echo/ vascular) testing. Due to room size and safety concerns, children are not allowed in the ultrasound rooms during exams. Our front office staff cannot provide observation of children in our lobby area while testing is being conducted. An adult accompanying a patient to their appointment will only be allowed in the ultrasound room at the discretion of the ultrasound technician under special circumstances. We apologize for any inconvenience.    Your cardiac CT will be scheduled at locations:    Jeralene Mom. [facility] [address]  [place],  [place] [address] Opening February 19, 2024    If scheduled at the Heart and Vascular Tower at [employer], please enter the parking lot using the [address] street entrance and use the FREE valet service at the patient drop-off area. Enter the buidling and check-in with registration on the main floor.   Please follow these instructions carefully (unless otherwise directed):  An IV will be required for this test and Nitroglycerin will be given.     On the Night Before the Test: Be sure to Drink plenty of water. Do not consume any caffeinated/decaffeinated beverages or chocolate 12 hours prior to your test. Do not take any antihistamines 12 hours prior to your test.   On the Day of the Test: Drink plenty of water until 1 hour prior to the test. Do not eat any food 1 hour prior to test. You may take your regular medications prior to the test.  Take metoprolol (Lopressor) 100 mg two hours prior to test. FEMALES- please wear underwire-free bra if available, avoid dresses & tight clothing      After the Test: Drink plenty of water. After receiving IV contrast, you may experience a mild flushed feeling. This is normal. On occasion, you may experience a mild rash up to 24 hours after the test. This is not dangerous. If this occurs, you can take Benadryl 25 mg, Zyrtec, Claritin, or Allegra and increase your fluid intake. (Patients taking Tikosyn should avoid Benadryl, and may take Zyrtec, Claritin, or Allegra) If you experience trouble breathing, this can be serious. If it is severe call 911 IMMEDIATELY. If it is  mild, please call our office.  We will call to schedule your test 2-4 weeks out understanding that some insurance companies will need an authorization prior to the service being performed.   For more information and frequently asked questions, please visit our website : [email or web address]  For non-scheduling related questions, please contact the cardiac imaging nurse  navigator should you have any questions/concerns: Cardiac Imaging Nurse Navigators Direct Office Dial: [phone]   For scheduling needs, including cancellations and rescheduling, please call [place], [phone].    Follow-Up: At [facility], you and your health needs are our priority.  As part of our continuing mission to provide you with exceptional heart care, our providers are all part of one team.  This team includes your primary Cardiologist (physician) and Advanced Practice Providers or APPs (Physician Assistants and Nurse Practitioners) who all work together to provide you with the care you need, when you need it.  Your next appointment will be dependent on the results of your testing and it will be with:     Provider:   Dr. Gaylyn Keas, MD

## 2024-03-04 NOTE — Addendum Note (Signed)
 Addended by: Render Carrie on: 03/04/2024 01:41 PM   Modules accepted: Orders

## 2024-03-04 NOTE — Addendum Note (Signed)
 Addended by: Cherylyn Cos on: 03/04/2024 01:36 PM   Modules accepted: Orders

## 2024-03-04 NOTE — Progress Notes (Signed)
 Cardiology CONSULT Note    Date:  03/04/2024   ID:  Christina Walsh, DOB Feb 23, 1956, MRN [id number]  PCP:  Christina Feeling, MD  Cardiologist:  Gaylyn Keas, MD   Chief Complaint  Patient presents with   New Patient (Initial Visit)    SOB and coronary artery calcifications    Patient Profile: Christina Walsh is a 68 y.o. female who is being seen today for the evaluation of shortness of breath and coronary artery calcifications on lung cancer screening chest CT At the request of Christina Feeling, MD.  History of Present Illness:  Christina Walsh is a 68 y.o. female who is being seen today for the evaluation of shortness of breath and coronary artery calcifications on lung screening CT of the chest at the request of Christina Feeling, MD.  This is a 68 year old obese female with a history of alcoholism, COPD, GERD, hypertension, hyperlipidemia, OSA and TIA.  She recently saw her PCP because of shortness of breath.  She does have a history of COPD.  She is a former smoker and quit 6 years ago.  She had a recent chest CT for lung cancer screening in the fall 2024 which showed coronary artery calcifications and aortic atherosclerosis.  She is now referred for cardiology consult for further workup.  She tells me that she has always had some degree SOB due to allergies and smoking.  Now she has noticed more SOB as she has started cleaning out her house and getting rid of things.  She has not had any chest pain or pressure.  The SOB is mainly with exertion but sometimes at rest she thinks she forgets to breathe and has to take a deep breath in.  She has occasional LE edema at times.  She denies any syncope but occasionally will feel like she is spinning.  She has noticed some palpitations especially wnen laying in bed on her right side.  Past Medical History:  Diagnosis Date   Achilles tendinitis    Alcoholism (HCC)    Anxiety    Arthritis    COPD (chronic obstructive pulmonary disease) (HCC)    not  used oxygen   at nite x 1-2 years    Depression    Dyspnea    with a lot of exertion    Family history of adverse reaction to anesthesia    sister- -N/V    GERD (gastroesophageal reflux disease)    History of carpal tunnel surgery    Hyperlipidemia    Hypertension    OSA (obstructive sleep apnea)    mild  supposed to use cpap  not used in 1-2 years    Pneumonia    as a small child    PVC (premature ventricular contraction)    TIA (transient ischemic attack)     Past Surgical History:  Procedure Laterality Date   BREAST BIOPSY     BREAST EXCISIONAL BIOPSY Bilateral 12/13/1996   benign   CERVICAL SPINE SURGERY     CESAREAN SECTION     ORIF ANKLE FRACTURE Left 09/09/2020   Procedure: OPEN REDUCTION INTERNAL FIXATION (ORIF) ANKLE FRACTURE;  Surgeon: Marlena Sima, MD;  Location: [facility];  Service: Orthopedics;  Laterality: Left;   TONSILLECTOMY      Current Medications: Current Meds  Medication Sig   aspirin  EC 81 MG tablet Take 81 mg by mouth daily.    atorvastatin  (LIPITOR) 10 MG tablet Take 10 mg by mouth daily.  buPROPion  (WELLBUTRIN  XL) 150 MG 24 hr tablet Take 150 mg by mouth daily. (Take with 300mg  tablet to equal 450mg  total)   buPROPion  (WELLBUTRIN  XL) 300 MG 24 hr tablet Take 300 mg by mouth daily. (Take with 150mg  tablet to equal 450mg  total)   cetirizine (ZYRTEC) 10 MG tablet Take 10 mg by mouth daily as needed (itching).   cloNIDine  (CATAPRES ) 0.1 MG tablet Take 0.1 mg by mouth 2 (two) times daily.    diazepam  (VALIUM ) 5 MG tablet Take 5 mg by mouth daily as needed for muscle spasms.    diltiazem  (DILACOR XR ) 240 MG 24 hr capsule Take 240 mg by mouth daily.   diphenhydrAMINE (BENADRYL) 25 mg capsule Take 25 mg by mouth every 6 (six) hours as needed for itching.   famotidine  (PEPCID ) 20 MG tablet Take 20 mg by mouth 2 (two) times daily as needed for heartburn.   ondansetron  (ZOFRAN -ODT) 4 MG disintegrating tablet Take 2 mg by mouth every 6 (six) hours as needed for  nausea or vomiting.   Tiotropium Bromide-Olodaterol 2.5-2.5 MCG/ACT AERS Inhale 2 puffs into the lungs daily.    Allergies:   Hydrocodone, Augmentin [amoxicillin-pot clavulanate], Celexa [citalopram hydrobromide], Ciprofloxacin, Codeine, Coppertone sport spray spf30 [sunscreens], Felodipine, Norvasc [amlodipine besylate], and Zoloft [sertraline hcl]   Social History   Socioeconomic History   Marital status: Married    Spouse name: Not on file   Number of children: Not on file   Years of education: Not on file   Highest education level: Not on file  Occupational History   Occupation: disabled  Tobacco Use   Smoking status: Former    Current packs/day: 0.00    Types: Cigarettes    Quit date: 10/24/2017    Years since quitting: 6.3   Smokeless tobacco: Never  Vaping Use   Vaping status: Never Used  Substance and Sexual Activity   Alcohol use: Not Currently    Comment: hx of    Drug use: No   Sexual activity: Not on file  Other Topics Concern   Not on file  Social History Narrative   Lives with husband in a one story home.  Has 2 grown children.  On disability.  Education: some college.    Social Drivers of Corporate investment banker Strain: Not on file  Food Insecurity: Not on file  Transportation Needs: Not on file  Physical Activity: Not on file  Stress: Not on file  Social Connections: Not on file     Family History:  The patient's family history includes Alcohol abuse in her mother; Heart attack in her father; Heart disease in her father; Hypertension in her father.   ROS:   Please see the history of present illness.    ROS All other systems reviewed and are negative.      No data to display             PHYSICAL EXAM:   VS:  BP (!) 150/75   Pulse 78   Ht 5' (1.524 m)   SpO2 91%   BMI 36.13 kg/m    GEN: Well nourished, well developed, in no acute distress  HEENT: normal  Neck: no JVD, carotid bruits, or masses Cardiac: RRR; no murmurs, rubs, or  gallops,no edema.  Intact distal pulses bilaterally.  Respiratory:  clear to auscultation bilaterally, normal work of breathing GI: soft, nontender, nondistended, + BS MS: no deformity or atrophy  Skin: warm and dry, no rash Neuro:  Alert and  Oriented x 3, Strength and sensation are intact Psych: euthymic mood, full affect  Wt Readings from Last 3 Encounters:  12/24/22 185 lb (83.9 kg)  07/12/21 200 lb (90.7 kg)  05/09/21 200 lb (90.7 kg)      Studies/Labs Reviewed:   EKG Interpretation Date/Time:  Monday Mar 04 2024 12:49:10 EDT Ventricular Rate:  78 PR Interval:  150 QRS Duration:  78 QT Interval:  352 QTC Calculation: 401 R Axis:   -21  Text Interpretation: Normal sinus rhythm Anterior infarct age undetermined Nonspecific T wave abnormality When compared with ECG of 09-Sep-2020 09:21, Nonspecific T wave abnormality, worse in Inferior leads Confirmed by Gaylyn Keas [phone]) on 03/04/2024 1:08:04 PM EKG Interpretation Date/Time:  Monday Mar 04 2024 12:49:10 EDT Ventricular Rate:  78 PR Interval:  150 QRS Duration:  78 QT Interval:  352 QTC Calculation: 401 R Axis:   -21  Text Interpretation: Normal sinus rhythm Anterior infarct age undetermined Nonspecific T wave abnormality When compared with ECG of 09-Sep-2020 09:21, Nonspecific T wave abnormality, worse in Inferior leads Confirmed by Gaylyn Keas ([id number]) on 03/04/2024 1:08:04 PM       Recent Labs: No results found for requested labs within last 365 days.   Lipid Panel No results found for: "CHOL", "TRIG", "HDL", "CHOLHDL", "VLDL", "LDLCALC", "LDLDIRECT"   Additional studies/ records that were reviewed today include:  Chest CT 07/14/2023 and office notes from PCP    ASSESSMENT:    1. SOB (shortness of breath)   2. Coronary artery calcification seen on CT scan   3. Primary hypertension   4. Hyperlipidemia LDL goal <70      PLAN:  In order of problems listed above:  #Shortness of breath #Coronary artery  calcifications - Patient's shortness of breath suspect is multifactorial given her history of tobacco abuse and COPD as well as obesity but does have cardiac risk factors as well as coronary artery calcifications on chest CT - I will get a coronary artery CTA to define coronary anatomy and assess for obstructive CAD - Check 2D echo to assess LV function  #Hypertension - BP borderline controlled on exam today but ran out of clonidine  and has to pick it up today - Continue clonidine  0.1 mg twice daily, diltiazem  XR 240 mg daily, losartan  100 mg daily with as needed refills  #Hyperlipidemia -LDL goal less than 70 due to coronary artery calcifications -I have personally reviewed and interpreted outside labs performed by patient's PCP which showed LDL 39, HDL 53 on 06/05/2023 -Continue with atorvastatin  10 mg daily with as needed refills  Time Spent: 20 minutes total time of encounter, including 15 minutes spent in face-to-face patient care on the date of this encounter. This time includes coordination of care and counseling regarding above mentioned problem list. Remainder of non-face-to-face time involved reviewing chart documents/testing relevant to the patient encounter and documentation in the medical record. I have independently reviewed documentation from referring provider  Followup:  PRN  Medication Adjustments/Labs and Tests Ordered: Current medicines are reviewed at length with the patient today.  Concerns regarding medicines are outlined above.  Medication changes, Labs and Tests ordered today are listed in the Patient Instructions below.  There are no Patient Instructions on file for this visit.   Signed, Gaylyn Keas, MD  03/04/2024 1:23 PM    [facility] [address] [place], [place], [place]  [address] Phone: [phone]; Fax: [phone]

## 2024-03-06 ENCOUNTER — Ambulatory Visit: Payer: Self-pay | Admitting: *Deleted

## 2024-03-06 LAB — BASIC METABOLIC PANEL WITH GFR
BUN/Creatinine Ratio: 14 (ref 12–28)
BUN: 13 mg/dL (ref 8–27)
CO2: 20 mmol/L (ref 20–29)
Calcium: 9.7 mg/dL (ref 8.7–10.3)
Chloride: 100 mmol/L (ref 96–106)
Creatinine, Ser: 0.94 mg/dL (ref 0.57–1.00)
Glucose: 77 mg/dL (ref 70–99)
Potassium: 4.7 mmol/L (ref 3.5–5.2)
Sodium: 140 mmol/L (ref 134–144)
eGFR: 66 mL/min/{1.73_m2} (ref >59)

## 2024-04-10 ENCOUNTER — Ambulatory Visit (HOSPITAL_COMMUNITY)
Admission: RE | Admit: 2024-04-10 | Discharge: 2024-04-10 | Disposition: A | Discharge status: Home / Self Care | Source: Ambulatory Visit | Attending: Cardiology | Admitting: Cardiology

## 2024-04-10 DIAGNOSIS — I2510 Atherosclerotic heart disease of native coronary artery without angina pectoris: Secondary | ICD-10-CM | POA: Diagnosis not present

## 2024-04-10 DIAGNOSIS — R0602 Shortness of breath: Secondary | ICD-10-CM

## 2024-04-10 DIAGNOSIS — I2584 Coronary atherosclerosis due to calcified coronary lesion: Secondary | ICD-10-CM | POA: Diagnosis not present

## 2024-04-10 DIAGNOSIS — I10 Essential (primary) hypertension: Secondary | ICD-10-CM | POA: Diagnosis not present

## 2024-04-10 DIAGNOSIS — J449 Chronic obstructive pulmonary disease, unspecified: Secondary | ICD-10-CM | POA: Diagnosis not present

## 2024-04-10 LAB — ECHOCARDIOGRAM COMPLETE
Area-P 1/2: 3.68 cm2
S' Lateral: 3.73 cm

## 2024-04-10 MED ORDER — PERFLUTREN LIPID MICROSPHERE
1.0000 mL | INTRAVENOUS | Status: AC | PRN
  Administered 2024-04-10: 2 mL via INTRAVENOUS

## 2024-04-18 NOTE — Telephone Encounter (Signed)
 Call to patient to advise of Low normal LV function EF 50 to 55% with mildly thickened heart muscle trivial leakiness of the mitral valve, mild calcification of the aortic valve with no aortic stenosis. Patient verbalizes understanding.

## 2024-04-18 NOTE — Telephone Encounter (Signed)
-----   Message from Christina Walsh sent at 04/10/2024  3:20 PM EDT ----- Low normal LV function EF 50 to 55% with mildly thickened heart muscle trivial leakiness of the mitral valve, mild calcification of the aortic valve with no aortic stenosis ----- Message ----- From: Interface, [employer] Sent: 04/10/2024   3:05 PM EDT To: Christina JONELLE Bihari, MD

## 2024-04-22 DIAGNOSIS — I2584 Coronary atherosclerosis due to calcified coronary lesion: Secondary | ICD-10-CM | POA: Diagnosis not present

## 2024-04-22 DIAGNOSIS — I10 Essential (primary) hypertension: Secondary | ICD-10-CM | POA: Diagnosis not present

## 2024-04-22 DIAGNOSIS — J449 Chronic obstructive pulmonary disease, unspecified: Secondary | ICD-10-CM | POA: Diagnosis not present

## 2024-04-23 DIAGNOSIS — F419 Anxiety disorder, unspecified: Secondary | ICD-10-CM | POA: Diagnosis not present

## 2024-04-23 DIAGNOSIS — F325 Major depressive disorder, single episode, in full remission: Secondary | ICD-10-CM | POA: Diagnosis not present

## 2024-05-09 DIAGNOSIS — I2584 Coronary atherosclerosis due to calcified coronary lesion: Secondary | ICD-10-CM | POA: Diagnosis not present

## 2024-05-09 DIAGNOSIS — J449 Chronic obstructive pulmonary disease, unspecified: Secondary | ICD-10-CM | POA: Diagnosis not present

## 2024-05-09 DIAGNOSIS — I10 Essential (primary) hypertension: Secondary | ICD-10-CM | POA: Diagnosis not present

## 2024-05-23 DIAGNOSIS — I2584 Coronary atherosclerosis due to calcified coronary lesion: Secondary | ICD-10-CM | POA: Diagnosis not present

## 2024-05-23 DIAGNOSIS — I10 Essential (primary) hypertension: Secondary | ICD-10-CM | POA: Diagnosis not present

## 2024-05-23 DIAGNOSIS — J449 Chronic obstructive pulmonary disease, unspecified: Secondary | ICD-10-CM | POA: Diagnosis not present

## 2024-06-06 DIAGNOSIS — Z0000 Encounter for general adult medical examination without abnormal findings: Secondary | ICD-10-CM | POA: Diagnosis not present

## 2024-06-06 DIAGNOSIS — M8588 Other specified disorders of bone density and structure, other site: Secondary | ICD-10-CM | POA: Diagnosis not present

## 2024-06-06 DIAGNOSIS — M5459 Other low back pain: Secondary | ICD-10-CM | POA: Diagnosis not present

## 2024-06-06 DIAGNOSIS — I10 Essential (primary) hypertension: Secondary | ICD-10-CM | POA: Diagnosis not present

## 2024-06-06 DIAGNOSIS — E7800 Pure hypercholesterolemia, unspecified: Secondary | ICD-10-CM | POA: Diagnosis not present

## 2024-06-06 DIAGNOSIS — Z1331 Encounter for screening for depression: Secondary | ICD-10-CM | POA: Diagnosis not present

## 2024-06-06 DIAGNOSIS — Z79899 Other long term (current) drug therapy: Secondary | ICD-10-CM | POA: Diagnosis not present

## 2024-06-06 DIAGNOSIS — I2584 Coronary atherosclerosis due to calcified coronary lesion: Secondary | ICD-10-CM | POA: Diagnosis not present

## 2024-06-06 DIAGNOSIS — K219 Gastro-esophageal reflux disease without esophagitis: Secondary | ICD-10-CM | POA: Diagnosis not present

## 2024-06-06 DIAGNOSIS — J449 Chronic obstructive pulmonary disease, unspecified: Secondary | ICD-10-CM | POA: Diagnosis not present

## 2024-06-07 ENCOUNTER — Other Ambulatory Visit: Payer: Self-pay | Admitting: Internal Medicine

## 2024-06-07 DIAGNOSIS — J449 Chronic obstructive pulmonary disease, unspecified: Secondary | ICD-10-CM

## 2024-06-08 DIAGNOSIS — I10 Essential (primary) hypertension: Secondary | ICD-10-CM | POA: Diagnosis not present

## 2024-06-08 DIAGNOSIS — J449 Chronic obstructive pulmonary disease, unspecified: Secondary | ICD-10-CM | POA: Diagnosis not present

## 2024-06-08 DIAGNOSIS — I2584 Coronary atherosclerosis due to calcified coronary lesion: Secondary | ICD-10-CM | POA: Diagnosis not present

## 2024-06-10 ENCOUNTER — Encounter: Payer: Self-pay | Admitting: Internal Medicine

## 2024-06-11 ENCOUNTER — Inpatient Hospital Stay: Admission: RE | Admit: 2024-06-11 | Source: Ambulatory Visit

## 2024-06-23 DIAGNOSIS — I10 Essential (primary) hypertension: Secondary | ICD-10-CM | POA: Diagnosis not present

## 2024-06-23 DIAGNOSIS — I2584 Coronary atherosclerosis due to calcified coronary lesion: Secondary | ICD-10-CM | POA: Diagnosis not present

## 2024-06-23 DIAGNOSIS — J449 Chronic obstructive pulmonary disease, unspecified: Secondary | ICD-10-CM | POA: Diagnosis not present

## 2024-07-08 ENCOUNTER — Ambulatory Visit
Admission: RE | Admit: 2024-07-08 | Discharge: 2024-07-08 | Disposition: A | Discharge status: Home / Self Care | Source: Ambulatory Visit | Attending: Internal Medicine | Admitting: Internal Medicine

## 2024-07-08 DIAGNOSIS — I2584 Coronary atherosclerosis due to calcified coronary lesion: Secondary | ICD-10-CM | POA: Diagnosis not present

## 2024-07-08 DIAGNOSIS — I10 Essential (primary) hypertension: Secondary | ICD-10-CM | POA: Diagnosis not present

## 2024-07-08 DIAGNOSIS — Z122 Encounter for screening for malignant neoplasm of respiratory organs: Secondary | ICD-10-CM | POA: Diagnosis not present

## 2024-07-08 DIAGNOSIS — J449 Chronic obstructive pulmonary disease, unspecified: Secondary | ICD-10-CM

## 2024-07-08 DIAGNOSIS — Z87891 Personal history of nicotine dependence: Secondary | ICD-10-CM | POA: Diagnosis not present

## 2024-07-23 DIAGNOSIS — I2510 Atherosclerotic heart disease of native coronary artery without angina pectoris: Secondary | ICD-10-CM | POA: Diagnosis not present

## 2024-07-23 DIAGNOSIS — J449 Chronic obstructive pulmonary disease, unspecified: Secondary | ICD-10-CM | POA: Diagnosis not present

## 2024-07-23 DIAGNOSIS — I2584 Coronary atherosclerosis due to calcified coronary lesion: Secondary | ICD-10-CM | POA: Diagnosis not present

## 2024-07-23 DIAGNOSIS — I10 Essential (primary) hypertension: Secondary | ICD-10-CM | POA: Diagnosis not present

## 2024-08-07 DIAGNOSIS — I2584 Coronary atherosclerosis due to calcified coronary lesion: Secondary | ICD-10-CM | POA: Diagnosis not present

## 2024-08-07 DIAGNOSIS — J449 Chronic obstructive pulmonary disease, unspecified: Secondary | ICD-10-CM | POA: Diagnosis not present

## 2024-08-07 DIAGNOSIS — I10 Essential (primary) hypertension: Secondary | ICD-10-CM | POA: Diagnosis not present

## 2024-08-23 DIAGNOSIS — I2584 Coronary atherosclerosis due to calcified coronary lesion: Secondary | ICD-10-CM | POA: Diagnosis not present

## 2024-08-23 DIAGNOSIS — J449 Chronic obstructive pulmonary disease, unspecified: Secondary | ICD-10-CM | POA: Diagnosis not present

## 2024-08-23 DIAGNOSIS — I10 Essential (primary) hypertension: Secondary | ICD-10-CM | POA: Diagnosis not present

## 2024-09-06 DIAGNOSIS — J449 Chronic obstructive pulmonary disease, unspecified: Secondary | ICD-10-CM | POA: Diagnosis not present

## 2024-09-06 DIAGNOSIS — I2584 Coronary atherosclerosis due to calcified coronary lesion: Secondary | ICD-10-CM | POA: Diagnosis not present

## 2024-09-06 DIAGNOSIS — I10 Essential (primary) hypertension: Secondary | ICD-10-CM | POA: Diagnosis not present

## 2024-09-22 DIAGNOSIS — I2584 Coronary atherosclerosis due to calcified coronary lesion: Secondary | ICD-10-CM | POA: Diagnosis not present

## 2024-09-22 DIAGNOSIS — J449 Chronic obstructive pulmonary disease, unspecified: Secondary | ICD-10-CM | POA: Diagnosis not present

## 2024-09-22 DIAGNOSIS — I10 Essential (primary) hypertension: Secondary | ICD-10-CM | POA: Diagnosis not present
